# Patient Record
Sex: Male | Born: 1935 | Race: White | Hispanic: No | Smoking: Never smoker
Health system: Southern US, Community
[De-identification: ages and names within clinical notes are randomized; demographics above are authoritative.]

## PROBLEM LIST (undated history)

## (undated) DIAGNOSIS — K449 Diaphragmatic hernia without obstruction or gangrene: Secondary | ICD-10-CM

## (undated) DIAGNOSIS — K222 Esophageal obstruction: Secondary | ICD-10-CM

## (undated) DIAGNOSIS — K5790 Diverticulosis of intestine, part unspecified, without perforation or abscess without bleeding: Secondary | ICD-10-CM

## (undated) DIAGNOSIS — K644 Residual hemorrhoidal skin tags: Secondary | ICD-10-CM

## (undated) DIAGNOSIS — I493 Ventricular premature depolarization: Secondary | ICD-10-CM

## (undated) DIAGNOSIS — I472 Ventricular tachycardia, unspecified: Secondary | ICD-10-CM

## (undated) DIAGNOSIS — I2510 Atherosclerotic heart disease of native coronary artery without angina pectoris: Secondary | ICD-10-CM

## (undated) DIAGNOSIS — I10 Essential (primary) hypertension: Secondary | ICD-10-CM

## (undated) DIAGNOSIS — H3530 Unspecified macular degeneration: Secondary | ICD-10-CM

## (undated) DIAGNOSIS — Z86010 Personal history of colonic polyps: Secondary | ICD-10-CM

## (undated) DIAGNOSIS — Z860101 Personal history of adenomatous and serrated colon polyps: Secondary | ICD-10-CM

## (undated) DIAGNOSIS — E785 Hyperlipidemia, unspecified: Secondary | ICD-10-CM

## (undated) DIAGNOSIS — E079 Disorder of thyroid, unspecified: Secondary | ICD-10-CM

## (undated) DIAGNOSIS — K219 Gastro-esophageal reflux disease without esophagitis: Secondary | ICD-10-CM

## (undated) HISTORY — DX: Diaphragmatic hernia without obstruction or gangrene: K44.9

## (undated) HISTORY — DX: Ventricular tachycardia: I47.2

## (undated) HISTORY — DX: Diverticulosis of intestine, part unspecified, without perforation or abscess without bleeding: K57.90

## (undated) HISTORY — PX: TONSILLECTOMY: SHX5217

## (undated) HISTORY — DX: Esophageal obstruction: K22.2

## (undated) HISTORY — DX: Essential (primary) hypertension: I10

## (undated) HISTORY — DX: Ventricular premature depolarization: I49.3

## (undated) HISTORY — DX: Unspecified macular degeneration: H35.30

## (undated) HISTORY — DX: Ventricular tachycardia, unspecified: I47.20

## (undated) HISTORY — PX: CATARACT EXTRACTION: SUR2

## (undated) HISTORY — DX: Disorder of thyroid, unspecified: E07.9

## (undated) HISTORY — DX: Residual hemorrhoidal skin tags: K64.4

## (undated) HISTORY — PX: EYE SURGERY: SHX253

## (undated) HISTORY — DX: Hyperlipidemia, unspecified: E78.5

## (undated) HISTORY — DX: Personal history of colonic polyps: Z86.010

## (undated) HISTORY — DX: Personal history of adenomatous and serrated colon polyps: Z86.0101

## (undated) HISTORY — DX: Atherosclerotic heart disease of native coronary artery without angina pectoris: I25.10

## (undated) HISTORY — DX: Gastro-esophageal reflux disease without esophagitis: K21.9

---

## 1998-12-14 ENCOUNTER — Other Ambulatory Visit: Admission: RE | Admit: 1998-12-14 | Discharge: 1998-12-14 | Payer: Self-pay | Admitting: Gastroenterology

## 1999-12-20 ENCOUNTER — Ambulatory Visit (HOSPITAL_COMMUNITY): Admission: RE | Admit: 1999-12-20 | Discharge: 1999-12-20 | Payer: Self-pay | Admitting: Cardiovascular Disease

## 1999-12-20 HISTORY — PX: CARDIAC CATHETERIZATION: SHX172

## 2002-12-08 DIAGNOSIS — K644 Residual hemorrhoidal skin tags: Secondary | ICD-10-CM | POA: Insufficient documentation

## 2004-04-16 ENCOUNTER — Encounter (INDEPENDENT_AMBULATORY_CARE_PROVIDER_SITE_OTHER): Payer: Self-pay | Admitting: Gastroenterology

## 2004-04-16 DIAGNOSIS — K2980 Duodenitis without bleeding: Secondary | ICD-10-CM | POA: Insufficient documentation

## 2004-04-16 DIAGNOSIS — K449 Diaphragmatic hernia without obstruction or gangrene: Secondary | ICD-10-CM | POA: Insufficient documentation

## 2005-04-09 ENCOUNTER — Ambulatory Visit: Payer: Self-pay | Admitting: Gastroenterology

## 2007-03-16 ENCOUNTER — Ambulatory Visit: Payer: Self-pay | Admitting: Internal Medicine

## 2007-12-22 ENCOUNTER — Ambulatory Visit: Payer: Self-pay | Admitting: Internal Medicine

## 2007-12-28 ENCOUNTER — Ambulatory Visit: Payer: Self-pay | Admitting: Internal Medicine

## 2007-12-28 ENCOUNTER — Encounter: Payer: Self-pay | Admitting: Internal Medicine

## 2008-03-02 ENCOUNTER — Telehealth: Payer: Self-pay | Admitting: Internal Medicine

## 2008-06-30 ENCOUNTER — Telehealth: Payer: Self-pay | Admitting: Internal Medicine

## 2008-09-11 ENCOUNTER — Telehealth: Payer: Self-pay | Admitting: Internal Medicine

## 2008-12-15 ENCOUNTER — Inpatient Hospital Stay (HOSPITAL_COMMUNITY): Admission: EM | Admit: 2008-12-15 | Discharge: 2008-12-16 | Payer: Self-pay | Admitting: Emergency Medicine

## 2008-12-15 HISTORY — PX: CARDIAC CATHETERIZATION: SHX172

## 2008-12-16 ENCOUNTER — Encounter (INDEPENDENT_AMBULATORY_CARE_PROVIDER_SITE_OTHER): Payer: Self-pay | Admitting: *Deleted

## 2008-12-27 ENCOUNTER — Encounter: Payer: Self-pay | Admitting: Internal Medicine

## 2009-02-01 ENCOUNTER — Encounter: Payer: Self-pay | Admitting: Internal Medicine

## 2009-02-20 ENCOUNTER — Telehealth: Payer: Self-pay | Admitting: Internal Medicine

## 2009-02-21 ENCOUNTER — Telehealth: Payer: Self-pay | Admitting: Internal Medicine

## 2009-03-27 ENCOUNTER — Telehealth: Payer: Self-pay | Admitting: Internal Medicine

## 2009-07-09 ENCOUNTER — Ambulatory Visit: Payer: Self-pay | Admitting: Internal Medicine

## 2009-07-09 DIAGNOSIS — K219 Gastro-esophageal reflux disease without esophagitis: Secondary | ICD-10-CM | POA: Insufficient documentation

## 2009-07-09 DIAGNOSIS — Z860100 Personal history of colon polyps, unspecified: Secondary | ICD-10-CM | POA: Insufficient documentation

## 2009-07-09 DIAGNOSIS — R143 Flatulence: Secondary | ICD-10-CM

## 2009-07-09 DIAGNOSIS — R141 Gas pain: Secondary | ICD-10-CM

## 2009-07-09 DIAGNOSIS — R142 Eructation: Secondary | ICD-10-CM

## 2010-01-01 ENCOUNTER — Telehealth: Payer: Self-pay | Admitting: Internal Medicine

## 2010-01-25 ENCOUNTER — Telehealth: Payer: Self-pay | Admitting: Internal Medicine

## 2010-05-21 ENCOUNTER — Encounter: Payer: Self-pay | Admitting: Internal Medicine

## 2010-08-19 ENCOUNTER — Ambulatory Visit: Payer: Self-pay | Admitting: Internal Medicine

## 2010-10-24 ENCOUNTER — Ambulatory Visit: Payer: Self-pay | Admitting: Cardiovascular Disease

## 2010-11-21 NOTE — Assessment & Plan Note (Signed)
Summary: Followup-GERD, gas   History of Present Illness Visit Type: Follow-up Visit Primary GI MD: Yancey Flemings MD Primary Daviona Herbert: Susa Raring, MD Chief Complaint: GERD, intermittent diarrhea and painful gas History of Present Illness:   77 year old with a history of hypertension, hyperlipidemia, GERD complicated by peptic stricture requiring esophageal dilation, hypothyroidism, and adenomatous colon polyps. He presents today for followup. He reports that his GERD has been under good control on lansoprazole. No active reflux symptoms or recurrent dysphagia on medication. Significant symptoms off medication. He wish to discuss any concerns with long term PPI use. Next, ongoing problems with intestinal gas as described last year. He has had this problem, without significant change, for at least 10 years. Previous self treatment with Lactaid and Activa did not help. He also complains of a change in bowel habits as manifested by occasional loose stools for 2 days followed by no bowel movement for several days. This happens approximately every 3-6 months. No nausea, vomiting, weight loss, or bleeding. Some abdominal discomfort with gas that is immediately relieved with the passing of flatus or bowel movement. His surveillance colonoscopy is up-to-date.   GI Review of Systems    Reports acid reflux and  bloating.      Denies abdominal pain, belching, chest pain, dysphagia with liquids, dysphagia with solids, heartburn, loss of appetite, nausea, vomiting, vomiting blood, weight loss, and  weight gain.      Reports change in bowel habits and  diarrhea.     Denies anal fissure, black tarry stools, constipation, diverticulosis, fecal incontinence, heme positive stool, hemorrhoids, irritable bowel syndrome, jaundice, light color stool, liver problems, rectal bleeding, and  rectal pain.    Current Medications (verified): 1)  Lansoprazole 30 Mg Cpdr (Lansoprazole) .... Take One By Mouth Daily,30 Minutes  Before Breakfast. 2)  Levothroid 88 Mcg Tabs (Levothyroxine Sodium) .... Once Daily 3)  Lovastatin 20 Mg Tabs (Lovastatin) .... Once Daily 4)  Aspirin 81 Mg Tbec (Aspirin) .... Once Daily 5)  Icaps Plus  Tabs (Multiple Vitamins-Minerals) .... Take 1 Tablet By Mouth Two Times A Day 6)  Metoprolol Succinate 25 Mg Xr24h-Tab (Metoprolol Succinate) .... Take 1 Tablet By Mouth Once A Day  Allergies (verified): 1)  ! Penicillin  Past History:  Past Medical History: Reviewed history from 04/06/2008 and no changes required. Adenomatous Colon Polyps Diverticulosis GERD Hyperlipidemia Hypertension  Past Surgical History: Cataract Extraction Tonsillectomy  Family History: Reviewed history from 07/09/2009 and no changes required. Lymphoma:Son No FH of Colon Cancer: Family History of Heart Disease: Both Parents  Social History: Reviewed history from 07/09/2009 and no changes required. Occupation: Retired Patient has never smoked.  Alcohol Use - no Illicit Drug Use - no  Review of Systems  The patient denies allergy/sinus, anemia, anxiety-new, arthritis/joint pain, back pain, blood in urine, breast changes/lumps, confusion, cough, coughing up blood, depression-new, fainting, fatigue, fever, headaches-new, hearing problems, heart murmur, heart rhythm changes, itching, muscle pains/cramps, night sweats, nosebleeds, shortness of breath, skin rash, sleeping problems, sore throat, swelling of feet/legs, swollen lymph glands, thirst - excessive, urination - excessive, urination changes/pain, urine leakage, vision changes, and voice change.    Vital Signs:  Patient profile:   77 year old male Height:      74 inches Weight:      183 pounds BMI:     23.58 Pulse rate:   52 / minute Pulse rhythm:   irregular BP sitting:   122 / 70  (left arm) Cuff size:   regular  Vitals  Entered By: Francee Piccolo CMA Duncan Dull) (August 19, 2010 8:27 AM)  Physical Exam  General:  Well developed, well  nourished, no acute distress. Head:  Normocephalic and atraumatic. Eyes:  PERRLA, no icterus. Mouth:  No deformity or lesions Neck:  Supple; no masses or thyromegaly. Lungs:  Clear throughout to auscultation. Heart:  Regular rate and rhythm; no murmurs, rubs,  or bruits. Abdomen:  Soft, nontender and nondistended. No masses, hepatosplenomegaly or hernias noted. Normal bowel sounds. Msk:  Symmetrical with no gross deformities. Normal posture. Pulses:  Normal pulses noted. Extremities:  trace edema right ankle Neurologic:  Alert and  oriented x4 Skin:  Intact without significant lesions or rashes. Psych:  Alert and cooperative. Normal mood and affect.   Impression & Recommendations:  Problem # 1:  GERD (ICD-530.81) GERD complicated by peptic stricture(530.3). Currently asymptomatic post dilation on PPI. We discussed the risk benefit ratio of ongoing PPI therapy. Also discussed some concerns that have been raised with long term PPI use including bone density issues. He is comfortable with continuing medication  Plan: #1. Refill lansoprazole 30 mg daily. #2. Reflux precautions #3. Repeat esophageal dilation p.r.n. recurrent dysphagia #4. Routine GI followup in one year  Problem # 2:  FLATULENCE-GAS-BLOATING (ICD-787.3) chronic problem without real change. Also mentioning some mild alteration in bowel habits as described  Plan: #1. Empiric trial of probiotic Align. Samples given. #2. Previously given literature on intestinal gas  Problem # 3:  PERSONAL HX COLONIC POLYPS (ICD-V12.72) surveillance up-to-date. Due for followup around March 2014  Patient Instructions: 1)  Samples of Align given 2)  Please schedule a follow-up appointment in 1 year. 3)  Copy sent to : Susa Raring, MD 4)  The medication list was reviewed and reconciled.  All changed / newly prescribed medications were explained.  A complete medication list was provided to the patient / caregiver.

## 2010-11-21 NOTE — Medication Information (Signed)
Summary: Prescriber Response form/[facility]  Prescriber Response form/[facility]   Imported By: Sherian Rein 05/24/2010 10:45:07  _____________________________________________________________________  External Attachment:    Type:   Image     Comment:   External Document

## 2010-11-21 NOTE — Progress Notes (Signed)
Summary: Medication refill   Phone Note Call from Patient Call back at Home Phone [phone]   Caller: Patient Call For: Dr. Marina Goodell Reason for Call: Refill Medication Summary of Call: Refill Lansoprazole to [facility] Fax# [phone] Initial call taken by: Karna Christmas,  January 25, 2010 2:22 PM    Prescriptions: LANSOPRAZOLE 30 MG CPDR (LANSOPRAZOLE) Take one by mouth daily,30 minutes before breakfast.  #90 x 3   Entered by:   Milford Cage NCMA   Authorized by:   Hilarie Fredrickson MD   Signed by:   Milford Cage NCMA on 01/25/2010   Method used:   Electronically to        [facility]* (mail-order)       [address] [place], [place]  [address]       Ph: [phone]       Fax: [phone]   RxID:   [id number]

## 2010-11-21 NOTE — Progress Notes (Signed)
Summary: Refill   Phone Note Call from Patient Call back at Home Phone [phone]   Call For: Dr Marina Goodell Reason for Call: Talk to Nurse Summary of Call: Lansoprizole will only allow one refill is half way done with it and if he is to continue taking it we would need to call [facility]. Needs a 90 day supply. Although Dr Marina Goodell gave him 3 refills his insurance wants him to have some sort of contact with the Doctor before they allow him to get his refills.  Initial call taken by: Leanor Kail [facility],  January 01, 2010 10:50 AM  Follow-up for Phone Call        called [facility] and left message for them to return my call in regards to patient's Rx.  Milford Cage [facility]  January 02, 2010 8:37 AM Patient called and advised that he will not need refill until first of April and that [employer] will only allow 6 months worth at one time.  He will need follow up appt. in September with Dr. Marina Goodell.  Patient will call when he needs it. Follow-up by: Milford Cage NCMA,  January 02, 2010 10:15 AM

## 2010-11-22 NOTE — Medication Information (Signed)
Summary: Drug Utilization Review/[facility]  Drug Utilization Review/[facility]   Imported By: Sherian Rein 05/24/2010 10:47:43  _____________________________________________________________________  External Attachment:    Type:   Image     Comment:   External Document

## 2011-02-04 LAB — CBC
HCT: 41 % (ref 39.0–52.0)
Hemoglobin: 14.2 g/dL (ref 13.0–17.0)
MCHC: 34.4 g/dL (ref 30.0–36.0)
MCV: 95.7 fL (ref 78.0–100.0)
RBC: 4.3 MIL/uL (ref 4.22–5.81)
RBC: 4.68 MIL/uL (ref 4.22–5.81)
RDW: 13.4 % (ref 11.5–15.5)

## 2011-02-04 LAB — BASIC METABOLIC PANEL
BUN: 12 mg/dL (ref 6–23)
Calcium: 8.4 mg/dL (ref 8.4–10.5)
GFR calc non Af Amer: >60 mL/min (ref >60)
Glucose, Bld: 97 mg/dL (ref 70–99)
Potassium: 3.5 mEq/L (ref 3.5–5.1)

## 2011-02-04 LAB — POCT I-STAT, CHEM 8
BUN: 18 mg/dL (ref 6–23)
Calcium, Ion: 1.15 mmol/L (ref 1.12–1.32)
Chloride: 101 meq/L (ref 96–112)
Creatinine, Ser: 1.2 mg/dL (ref 0.4–1.5)
Glucose, Bld: 179 mg/dL — ABNORMAL HIGH (ref 70–99)
HCT: 43 % (ref 39.0–52.0)
Hemoglobin: 14.6 g/dL (ref 13.0–17.0)
Potassium: 3.4 mEq/L — ABNORMAL LOW (ref 3.5–5.1)
Sodium: 142 mEq/L (ref 135–145)
TCO2: 26 mmol/L (ref 0–100)

## 2011-02-04 LAB — LIPID PANEL
Cholesterol: 124 mg/dL (ref 0–200)
HDL: 38 mg/dL — ABNORMAL LOW (ref >39)
LDL Cholesterol: 75 mg/dL (ref 0–99)
Total CHOL/HDL Ratio: 3.3 ratio
Triglycerides: 57 mg/dL (ref <150)
VLDL: 11 mg/dL (ref 0–40)

## 2011-02-04 LAB — DIFFERENTIAL
Lymphocytes Relative: 9 % — ABNORMAL LOW (ref 12–46)
Lymphs Abs: 1 10*3/uL (ref 0.7–4.0)
Neutrophils Relative %: 86 % — ABNORMAL HIGH (ref 43–77)

## 2011-02-04 LAB — COMPREHENSIVE METABOLIC PANEL
AST: 23 U/L (ref 0–37)
CO2: 31 mEq/L (ref 19–32)
Calcium: 8.9 mg/dL (ref 8.4–10.5)
Creatinine, Ser: 0.86 mg/dL (ref 0.4–1.5)
GFR calc Af Amer: >60 mL/min (ref >60)
GFR calc non Af Amer: >60 mL/min (ref >60)
Glucose, Bld: 169 mg/dL — ABNORMAL HIGH (ref 70–99)

## 2011-02-04 LAB — PROTIME-INR
INR: 1.1 (ref 0.00–1.49)
Prothrombin Time: 14.2 seconds (ref 11.6–15.2)

## 2011-02-04 LAB — CARDIAC PANEL(CRET KIN+CKTOT+MB+TROPI)
CK, MB: 1.3 ng/mL (ref 0.3–4.0)
CK, MB: 2.3 ng/mL (ref 0.3–4.0)
Relative Index: 2.3 (ref 0.0–2.5)
Relative Index: INVALID (ref 0.0–2.5)
Total CK: 102 U/L (ref 7–232)
Total CK: 85 U/L (ref 7–232)
Troponin I: <0.01 ng/mL (ref 0.00–0.06)

## 2011-02-04 LAB — TSH: TSH: 1.2 u[IU]/mL (ref 0.350–4.500)

## 2011-02-04 LAB — APTT: aPTT: 31 seconds (ref 24–37)

## 2011-02-17 ENCOUNTER — Telehealth: Payer: Self-pay | Admitting: Internal Medicine

## 2011-02-17 NOTE — Telephone Encounter (Signed)
Left message for pt to call back.  Pt states his pharmacy has requested a refill and not heard from [place] as of yet. Cannot find any faxes or documentation where the pharmacy has sent for a refill. Attempted to call [facility] and got answering machine. Called pt back and let him know that I have not seen any forms and pt will call the pharmacy back and request refill. Gave pt the fax number to have the pharmacy send the request to.

## 2011-03-04 NOTE — Consult Note (Signed)
NAME:  Todd Benton, Todd Benton NO.:  [id number]   MEDICAL RECORD NO.:  [id number]          PATIENT TYPE:  INP   LOCATION:  2013                         FACILITY:  [facility]   PHYSICIAN:  Corinna L. Lendell Benton, MDDATE OF BIRTH:  [birth year]   DATE OF CONSULTATION:  12/16/2008  DATE OF DISCHARGE:                                 CONSULTATION   REASON FOR CONSULTATION:  Noncardiac chest pain.   IMPRESSIONS/RECOMMENDATIONS:  1. Pain:  I suspect this is GI related.  He reports a history of      chronic gas pains and has a history of esophageal stricture which      has been dilated.  He also has alternating constipation and      diarrhea.  The chest x-ray shows dilated bowel in the upper      abdomen.  I will get abdominal films to evaluate further and look      for ileus or obstruction.  If this shows no obstruction or ileus, a      trial of Carafate may be warranted, as the patient is already on      proton pump inhibitor.  Also I did recommend he try a diet without      dairy products for a week or so.  He may be lactose intolerant.  2. Orthostatic dizziness:  I recommended holding hydrochlorothiazide,      checking orthostatic vitals, and giving normal saline bolus.  3. Hypothyroidism:  TSH normal.  4. Hyperlipidemia.  5. Hypertension.   If abdominal films are unremarkable, the patient can be discharged from  my perspective.  He will need a trial of Carafate 1 g p.o. q.a.c. and  nightly and I would hold hydrochlorothiazide and reassess as an  outpatient in a week or two.   HISTORY OF PRESENT ILLNESS:  Todd Benton is a pleasant 77 year old white  male patient of Todd Benton who was admitted after cardiac  catheterization yesterday.  He had nonobstructive coronary artery  disease and normal ejection fraction and his pain was not felt to be  secondary to coronary artery disease.  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  He felt dizzy and fatigued and had to sit down.  Currently, he feels fine, but he has not been out of bed.  When I did  have him sit up, he became immediately dizzy.  He reports no flatus over  the past 15 hours which is unusual for him because he is usually very  gassy.  He has tolerated supper and is hungry.  He has had nausea,  particularly when he stands up or sits up.  He also has a history of  vertigo and he did feel a spinning sensation yesterday.   PAST MEDICAL HISTORY:  As above.   MEDICATIONS:  1. Hydrochlorothiazide 25 mg a day.  2. Synthroid 88 mcg a day.  3. Aspirin 81 mg a day.  4. Protonix 40 mg a day.  5. Simvastatin 10 mg a day.   SOCIAL  HISTORY:  The patient is married.  He denies heavy drinking.  He  does not smoke.   FAMILY HISTORY:  His father died at age 71 of a heart attack.  Mother  had an MI in her 47s.  Several brothers with coronary artery disease.   REVIEW OF SYSTEMS:  As above, otherwise negative.   PHYSICAL EXAMINATION:  VITAL SIGNS:  His temperature is 97.7, blood  pressure has ranged 106-116/60-63 supine, respiratory rate is 18, no  orthostatic have been done, oxygen saturation 98% on room air.  GENERAL:  The patient is well nourished, well developed in no acute  distress.  HEENT:  Normocephalic, atraumatic.  Pupils are equal, round, and  reactive to light.  Sclerae nonicteric.  Moist mucous membranes.  NECK:  Supple.  No carotid bruits.  No lymphadenopathy.  LUNGS:  Clear to auscultation bilaterally without wheezes, rhonchi, or  rales.  CARDIOVASCULAR:  Regular rate and rhythm without murmurs, gallops, or  rubs.  ABDOMEN:  Diminished bowel sounds, soft, nontender, nondistended.  GU AND RECTAL:  Deferred.  EXTREMITIES:  No clubbing, cyanosis, or edema.  SKIN:  No rash.  No diaphoresis.  NEUROLOGIC:  The patient is alert and oriented.  Cranial nerves intact.  Strength intact.  Romberg negative.  He did have  orthostatic dizziness  when he sat up.  Finger-to-nose normal.  PSYCHIATRIC:  Normal affect.   LABORATORY DATA:  CBC unremarkable.  PT/PTT normal.  Basic metabolic  panel normal.  Serial cardiac enzymes negative.  LDL 75, HDL 38,  triglycerides 57, TSH 1.2.  EKG showed sinus bradycardia with a rate of  58, PVC, nonspecific changes.  Chest x-ray showed no infiltrate.  Heart  size is upper normal.  Mild gaseous distention of bowel loop versus  stomach in the upper abdomen that was partially imaged.   Thank you Dr. Elease Benton for this consultation.  Further recommendations to  follow based on data base.      Corinna L. Lendell Caprice, MD  Electronically Signed     CLS/MEDQ  D:  12/16/2008  T:  12/16/2008  Job:  [id number]   cc:   Todd Benton, M.D.  Todd Benton, M.D.

## 2011-03-04 NOTE — Cardiovascular Report (Signed)
NAME:  [name], [name] NO.:  [id number]   MEDICAL RECORD NO.:  [id number]          PATIENT TYPE:  INP   LOCATION:  2013                         FACILITY:  [facility]   PHYSICIAN:  Vesta Mixer, M.D. DATE OF BIRTH:  03/28/1930   DATE OF PROCEDURE:  12/15/2008  DATE OF DISCHARGE:                            CARDIAC CATHETERIZATION   Todd Benton is a 77 year old gentleman with a history of mild coronary  artery irregularities.  He presented to the office today with episodes  of chest pain and ST-segment depression consistent with coronary  ischemia.  He was referred for heart catheterization.  He was admitted  to the hospital.   PROCEDURE:  Left heart catheterization with coronary angiography.   The right femoral artery was easily cannulated using a modified  Seldinger technique.   HEMODYNAMICS:  LV pressure is 110/10 with an aortic pressure of 104/54.   ANGIOGRAPHY:  Left main:  Left main has minor coronary artery  irregularities.   The left anterior descending artery has mild irregularities proximally.  The mid vessel has a 30% stenosis in a napkin ring stenosis of 30%.  The  distal LAD has mild irregularities.   The left circumflex artery has minor luminal irregularities.  Obtuse  marginal arteries are normal.   The right coronary artery has minor irregularities in the proximal  segment.  The mid vessel has a focal 30-40% stenosis.  The mid vessel is  smooth and fairly normal.   The posterior descending artery and the posterolateral segment artery  are normal.   The left ventriculogram was performed in the 30-RAO position.  It  reveals an ejection fraction of around 50%.  There was some arrhythmias  and catheter-induced ventricular ectopy.  There is possible slight  hypokinesis of the anterior wall.   COMPLICATIONS:  None.   CONCLUSION:  Nonobstructive coronary arteries.  His left ventricular  systolic function is at the lower limits of normal.  He  had some  segmental wall motion abnormalities, but I think they are probably due  to catheter-induced ectopy.  He does not have any severe coronary artery  disease to explain his chest pain.  We will need to look for other  etiologies of chest pain.      Vesta Mixer, M.D.  Electronically Signed     PJN/MEDQ  D:  12/15/2008  T:  12/16/2008  Job:  [id number]   cc:   Oley Balm. Georgina Pillion, M.D.

## 2011-03-04 NOTE — Assessment & Plan Note (Signed)
[facility]                         GASTROENTEROLOGY OFFICE NOTE   NAME:Todd Benton, Todd Benton                       MRN:          [id number]  DATE:03/16/2007                            DOB:          06-26-1930    REASON FOR CONSULTATION:  Increased intestinal gas, medication refill,  and to establish as a new patient.   HISTORY:  This is a 77 year old white male with a history of adenomatous  colon polyps, and gastroesophageal reflux disease complicated by peptic  stricture, for which he has undergone prior esophageal dilation.  The  patient has not been seen in this office for 3 years.  He has had  several colonoscopies, the most recent of which was in February 2004.  There were no colon polyps at that time.  Followup in 5 years  recommended.  The patient underwent upper endoscopy for a symptomatic  peptic stricture in June 2005.  He was dilated with a 54 [place] Maloney  dilator, and placed on Prevacid 30 mg daily.  He has continued on  Prevacid, and has had no recurrent dysphagia.  He needs a refill of his  Prevacid Solu Tabs.  The patient complains of chronic intestinal gas  that is manifested by cramping gas pains and flatus.  The problem has  been worse in recent months.  No nausea, vomiting, or change in bowel  habits.  His bowels tend to be a bit on the constipated side.  No new  medications or change in diet.  He does use Gas-X and Pepto Bismol with  variable results.   ALLERGIES:  PENICILLIN.   CURRENT MEDICATIONS:  1. Levothyroxine 88 mcg daily.  2. Lovastatin 20 mg daily.  3. Hydrochlorothiazide 25 mg daily.  4. Prevacid Solu Tab 30 mg daily.  5. Baby aspirin 81 mg daily.   PAST MEDICAL HISTORY:  1. Hypothyroidism.  2. Hyperlipidemia.  3. Reflux disease with stricture.  4. Adenomatous colon polyps.   FAMILY HISTORY:  Heart disease in his parents and brother.  Negative for  GI malignancy.   SOCIAL HISTORY:  Patient is retired from  [employer].  He is married.  He does  not smoke or use alcohol.  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   PHYSICAL EXAMINATION:  Well-appearing male, in no acute distress.  Blood pressure is 110/70.  Heart rate is 68.  Weight is 188 pounds.  HEENT:  Sclerae anicteric.  Conjunctivae are pink.  LUNGS:  Clear.  HEART:  Regular.  ABDOMEN:  Soft without tenderness, mass or hernia.  Good bowel sounds  heard.   IMPRESSION:  1. Gastroesophageal reflux disease complicated by peptic stricture.      Currently asymptomatic, on Prevacid, postdilation.  2. History of adenomatous colon polyps.  Last colonoscopy negative.      Followup due in February 2009.  3. Increased intestinal gas manifested by cramping and flatus.      Chronic problem.  Worsened recently.   RECOMMENDATIONS:  1. Refill Prevacid Solu Tab 30 mg daily as requested.  2. Trial of the probiotic Align 1 daily for 2 weeks  to see if this      improves his gas.  3. A brochure on increased intestinal gas as well as anti-gas and      flatulence dietary sheet.  4. Surveillance colonoscopy in 2009 as recommended.  5. Ongoing general medical care with Dr. Georgina Pillion.     Wilhemina Bonito. Marina Goodell, MD  Electronically Signed    JNP/MedQ  DD: 03/16/2007  DT: 03/16/2007  Job #: [id number]   cc:   Oley Balm. Georgina Pillion, M.D.

## 2011-03-04 NOTE — H&P (Signed)
NAME:  NASIER, THUMM NO.:  [id number]   MEDICAL RECORD NO.:  [id number]          PATIENT TYPE:  EMS   LOCATION:  [facility]                         FACILITY:  [facility]   PHYSICIAN:  Vesta Mixer, M.D. DATE OF BIRTH:  1930-07-08   DATE OF ADMISSION:  12/15/2008  DATE OF DISCHARGE:                              HISTORY & PHYSICAL   Todd Benton is an elderly gentleman with a history of chest pains and  hypothyroidism.  He is admitted today with an episode of chest pain and  EKG changes.   Todd Benton was seen last in our office approximately 4 years ago.  He  has had a heart catheterization, which revealed minor coronary artery  irregularities.  He is overall done fairly well and has not been seen  since that time.   Starting several days ago, he started having some generalized weakness  and some shortness of breath especially with exertion.  This morning, he  has had intense nausea with chest pressure.  He feels like he needs to  belch continuously.  The pain was a 4/10 when he arrived.  He was given  nitroglycerin with some relief with fairly promptly relief of the pain.  He has had some generalized weakness and states he just does not feel  right.  He denies any cough or cold or sputum production.  He denies any  fever.  He denies any PND or orthopnea.  He denies any syncope or  presyncope.   He denies any blood in the stool, blood in his urine.  He denies any  aches and pains, rash or skin nodules.  He denies any weight gain or  weight loss.   CURRENT MEDICATIONS:  1. Synthroid 88 mcg a day.  2. Aspirin 81 mg a day.  3. Prevacid 30 mg a day.  4. Lovastatin 20 mg a day.  5. Hydrochlorothiazide 25 mg.  6. ICaps once a day.   ALLERGIES:  He is allergic to PENICILLIN.   PAST MEDICAL HISTORY:  1. Hypothyroidism.  2. Hyperlipidemia.  3. History of iron deficiency anemia.  4. Hypertension.  5. Chest pain.   SOCIAL HISTORY:  The patient is a nonsmoker.  He  does not drink alcohol.   FAMILY HISTORY:  Positive for myocardial infarction.  His father died at  age 23 of an MI.  His mother had an MI in her 59s but lived to be 48.  He had several brothers with coronary artery disease.   REVIEW OF SYSTEMS:  Reviewed in the history of present illness.  All  other systems are reviewed and are negative.   PHYSICAL EXAMINATION:  GENERAL:  He is an elderly gentleman in no acute  distress.  He is alert and oriented x3 and his mood and affect are  normal.  VITAL SIGNS:  His weight is 188, blood pressure initially was 100/60  with a heart rate of 64.  Several hours later, his blood pressure was  130/65.  NECK:  2+ carotids.  He has no bruits, no JVD, no thyromegaly.  His neck  is supple.  LUNGS:  Clear.  HEART:  Regular rate S1 and S2.  His PMI is nondisplaced.  BACK:  Nontender.  ABDOMEN:  Good bowel sounds.  He has no hepatosplenomegaly.  There is no  guarding or rebound.  EXTREMITIES:  He has 2+ pulses.  SKIN:  Warm and dry.  Skin does appear to be a bit ashen, but there is  no diaphoresis.  EXTREMITIES:  He has no clubbing, cyanosis, or edema.  His gait is  normal.   EKG reveals normal sinus rhythm.  He has ST-segment depression in the  inferior and lateral leads.   LABORATORY DATA:  Pending.   Todd Benton presents with several hours of chest pain.  He has ST-  segment depression in the inferolateral leads.  We will schedule him.  We will go ahead and admit him to the hospital and place him on a nitro  drip.  We will anticipate heart catheterization tonight.  We have  discussed the risks, benefits, and options of heart catheterization.  He  understands and agrees to proceed.      Vesta Mixer, M.D.  Electronically Signed     PJN/MEDQ  D:  12/15/2008  T:  12/16/2008  Job:  [id number]   cc:   Oley Balm. Georgina Pillion, M.D.

## 2011-03-04 NOTE — Assessment & Plan Note (Signed)
[facility]                         GASTROENTEROLOGY OFFICE NOTE   Kadrian, Partch GEN CLAGG                       MRN:          [id number]  DATE:12/22/2007                            DOB:          02-Jun-1930    HISTORY:  Todd Benton presents today with complaints of diarrhea and  increased intestinal gas.  He is a 77 year old white male with a history  of adenomatous colon polyps, gastroesophageal reflux disease,  complicated by peptic stricture, for which he has undergone prior  esophageal dilation; hypothyroidism, and hyperlipidemia.  The patient  was last evaluated in the office Mar 16, 2007, for increased intestinal  gas, medication refill, and to establish as a new GI patient.  For his  reflux, he is on Prevacid SoluTab 30 mg daily.  At the time of his last  visit, he was given a two-week trial of the probiotic, Align.  He did  not feel this improved his gas.  He is due for surveillance colonoscopy  at this time.  He reports to me, two weeks ago, developing problems with  loose, watery stools, as well as increased gas and hyperactive bowel  sounds.  Stools were loose and runny without blood.  No nausea,  vomiting, fevers or weight-loss.  He thought possibly the symptoms were  due to Prevacid, as he had read this to be a potential side effect.  However, he had been on the medication for three and a half years prior  without similar symptoms.  He discontinued the Prevacid and has been off  the medication for about ten days.  Symptoms may have improved some.  He  also received a letter from [facility] questioning his chronic need for  proton pump inhibitor therapy.  I reviewed with him that he has reflux  disease, complicated by peptic stricture, that has required esophageal  dilation.  The proton pump inhibitor is good for control of symptoms, as  well as reducing damage to the esophagus with recurrent stricturing.  Patient denies mouth ulcers, recent  antibiotic treatment, unusual  travel, or exposure to persons with similar symptoms.   He is ALLERGIC TO PENICILLIN.   CURRENT MEDICATIONS INCLUDE:  Levothyroxine, Lovastatin,  hydrochlorothiazide, Prevacid SoluTab, baby aspirin, and multivitamin.   PHYSICAL EXAM:  Finds a well-appearing male, in no acute distress.  Blood pressure is 154/54, heart rate 72 and regular, weight is 188.6  pounds.  HEENT:  Sclerae are anicteric, conjunctivae are pink.  Oral mucosa is  intact, no aphthous ulcerations.  LUNGS:  Clear.  HEART:  Regular.  ABDOMEN:  Soft without tenderness, mass or hernia.  Bowel sounds are  active.  EXTREMITIES:  Without edema.   IMPRESSION:  1. Recent problems with loose stools, nonspecific.  Most likely self-      limited infectious process.  2. Chronic problems with gas and bloating.  3. History of reflux disease, complicated by peptic stricture,      requiring esophageal dilation.  Currently without recurrent      dysphagia.  4. History of adenomatous colon polyps, due for surveillance.   RECOMMENDATIONS:  1. Empiric trial  of metronidazole 250 mg q.i.d. for one week to see if      this impacts problems with gas and diarrhea.  2. Okay to hold Prevacid for now, but should resume, if he develops      indigestion or heartburn.  Will likely need something long-term to      prevent premature recurrence of esophageal stricturing.  3. Schedule colonoscopy with polypectomy if needed, and possible      biopsies if loose stools persist.  The nature of the procedure, as      well as the risks, benefits, and alternatives, were discussed in      detail.  He understood and agreed to proceed.  4. Continue general medical care with Dr. Georgina Pillion.     Wilhemina Bonito. Marina Goodell, MD  Electronically Signed    JNP/MedQ  DD: 12/22/2007  DT: 12/22/2007  Job #: [id number]   cc:   Oley Balm. Georgina Pillion, M.D.

## 2011-03-07 NOTE — Discharge Summary (Signed)
NAME:  Todd Benton, Todd Benton NO.:  [id number]   MEDICAL RECORD NO.:  [id number]          PATIENT TYPE:  INP   LOCATION:  2013                         FACILITY:  [facility]   PHYSICIAN:  Vesta Mixer, M.D. DATE OF BIRTH:  1930/07/11   DATE OF ADMISSION:  12/15/2008  DATE OF DISCHARGE:  12/16/2008                               DISCHARGE SUMMARY   DISCHARGE DIAGNOSES:  1. Noncardiac chest pain.  2. Possible gastrointestinal pain.  3. Hypothyroidism.  4. Hyperlipidemia.  5. Hypertension.   DISCHARGE MEDICATIONS:  1. Synthroid 88 mcg a day.  2. Aspirin 81 mg a day.  3. Prevacid 30 mg a day.  4. Lovastatin 20 mg a day.  5. ICaps once a day.   DISPOSITION:  The patient will see Dr. Elease Hashimoto in a week or two.  He is  to follow up with his general medical doctor Lajean Manes in the next  week or two.   HISTORY:  Todd Benton is an elderly gentleman who was admitted through  the office with episodes of chest pain and EKG changes.  Please see  dictated H and P for further details.   HOSPITAL COURSE BY PROBLEMS:  Chest pain.  The patient was admitted and  underwent urgent heart catheterization.  He was found to have only minor  luminal irregularities.  He did have some moderate stenosis but none  were felt to be significant.  His left ventricular systolic function was  fairly normal.  His EF was 50%.  There was no mitral regurgitation.   The patient felt quite better after the heart catheterization and did  not have any further episodes of chest pain.  He ruled out for  myocardial infarctions.  He was seen by Dr. Crista Curb for  consultation.  He will follow up for these episodes of pain with Dr.  Elease Hashimoto in a week or two and will follow up with Dr. Georgina Pillion in several  weeks.  All his other medical problems were relatively stable.      Vesta Mixer, M.D.  Electronically Signed     PJN/MEDQ  D:  02/01/2009  T:  02/02/2009  Job:  [id number]

## 2011-03-07 NOTE — Cardiovascular Report (Signed)
[place]  Patient:    Todd Benton, Todd Benton                         MRN: [id number] Proc. Date: 12/20/99 Adm. Date:  [id number] Disc. Date: [id number] Attending:  Koren Bound CC:         Cardiac Catheterization Laboratory             Oley Balm. Georgina Pillion, M.D.                        Cardiac Catheterization  PROCEDURE:  Left heart catheterization with coronary angiography.  CARDIOLOGIST:  Alvia Grove., M.D.  INDICATIONS:  [name] is a 77 year old gentleman with a history of chest pain in the past.  He recently had an adenosine Cardiolite study which revealed attenuation in the inferior wall, consistent with inferior wall myocardial infarction.  He also had small Q-waves in the inferior leads.  He is referred for a heart catheterization for further evaluation.  DESCRIPTION OF PROCEDURE:  The right femoral artery was easily cannulated using a modified Seldinger technique.  HEMODYNAMICS: LV pressure:  142/25. Aortic pressure:  141/81.  ANGIOGRAPHY: 1. Left main coronary artery:  Relatively smooth and normal. 2. Left anterior descending coronary artery:  Has minor luminal irregularities.    There are no stenoses greater than 10% or 20%.  There are three moderate-sized    diagonal branches, each of which are essentially normal.  The distal LAD    reaches around the apex and supplies a good bit of the inferoapical wall. 3. Left circumflex coronary artery:  Is a moderate-sized vessel.  It supplies a    large first obtuse marginal artery and a medium-sized second obtuse marginal    artery.  These vessels are essentially normal. 4. Right coronary artery:  Is a large and dominant vessel.  There is a 20%    stenosis in the mid-vessel.  The stenosis does not appear to obstruct    blood flow.  The posterior descending artery and the posterior lateral    segment arteries are normal.  LEFT VENTRICULOGRAM:  Was performed in a 30-degree RAO  position.  There is some  catheter-induced ectopy but the overall ejection fraction appears to be normal.  The ejection fraction is approximately 65%-70%.  There is no mitral regurgitation. In particular the inferior wall contracts normally.  COMPLICATIONS:  None.  CONCLUSIONS: 1. Essentially normal coronary arteries, with only minor luminal irregularities.    This degree of minor irregularities is consistent with someone 77 years old. 2. Normal left ventricular systolic function. DD:  12/20/99 TD:  12/22/99 Job: [id number]

## 2011-04-29 ENCOUNTER — Encounter: Payer: Self-pay | Admitting: Cardiovascular Disease

## 2011-05-06 ENCOUNTER — Ambulatory Visit (INDEPENDENT_AMBULATORY_CARE_PROVIDER_SITE_OTHER): Payer: Medicare Other | Admitting: Cardiovascular Disease

## 2011-05-06 ENCOUNTER — Encounter: Payer: Self-pay | Admitting: Cardiovascular Disease

## 2011-05-06 VITALS — BP 130/88 | HR 66 | Ht 73.0 in | Wt 188.2 lb

## 2011-05-06 DIAGNOSIS — I4949 Other premature depolarization: Secondary | ICD-10-CM

## 2011-05-06 DIAGNOSIS — I493 Ventricular premature depolarization: Secondary | ICD-10-CM | POA: Insufficient documentation

## 2011-05-06 NOTE — Progress Notes (Signed)
Rinaldo Cloud Date of Birth  April 16, 1930 [facility] [address]     [address] [place], [place]  [address] [phone]  Fax  [phone]  History of Present Illness:  No medical issues.  Pt has been under lots of stress with the recent death of his brother. Had to manage his brother's estate.  Lots of mental stress.  Not yet exercising yet.  Current Outpatient Prescriptions on File Prior to Visit  Medication Sig Dispense Refill  . aspirin 81 MG tablet Take 81 mg by mouth daily.        . lansoprazole (PREVACID) 30 MG capsule Take 30 mg by mouth daily.        Marland Kitchen levothyroxine (SYNTHROID, LEVOTHROID) 88 MCG tablet Take 88 mcg by mouth daily.        Marland Kitchen lovastatin (MEVACOR) 20 MG tablet Take 20 mg by mouth at bedtime.        . LUTEIN PO Take by mouth daily.        . Multiple Vitamins-Minerals (OCUVITE PRESERVISION PO) Take by mouth daily.        Marland Kitchen POTASSIUM CHLORIDE PO Take 99 mg by mouth 2 (two) times daily. 200 mg daily        Allergies  Allergen Reactions  . Penicillins     Past Medical History  Diagnosis Date  . Thyroid disease   . Hypertension   . Hyperlipidemia   . Coronary artery disease     nonobstuctive  . Premature ventricular contractions     frequent  . Ventricular tachycardia     nonsustained    Past Surgical History  Procedure Date  . Tonsillectomy     at age 26  . Cardiac catheterization 12/15/2008    EF50%/nonobstuctive coronary arteries/lt ventricular systolic function is at the lower limits of normal/segmental wall motion abnormalities probably due to catheter-induced ectopy/no severe CAD to explain chest pain  . Cardiac catheterization 12/20/1999    EF65/70%/normal coronary arteries with only minor luminal irregularities consistent with someone 77 yrs old/normal lt ventricular systolic function    History  Smoking status  . Never Smoker   Smokeless tobacco  . Not on file    History  Alcohol Use No     Family History  Problem Relation Age of Onset  . Heart attack Mother   . Stroke Mother   . Heart attack Father 48  . Cancer Son 8    Reviw of Systems:  Reviewed in the HPI.  All other systems are negative.  Physical Exam: BP 130/88  Pulse 66  Ht 6\' 1"  (1.854 m)  Wt 188 lb 3.2 oz (85.367 kg)  BMI 24.83 kg/m2 The patient is alert and oriented x 3.  The mood and affect are normal.   Skin: warm and dry.  Color is normal.    HEENT:   the sclera are nonicteric.  The mucous membranes are moist.  The carotids are 2+ without bruits.  There is no thyromegaly.  There is no JVD.    Lungs: clear.  The chest wall is non tender.    Heart: regular rate with a normal S1 and S2.  There are no murmurs, gallops, or rubs. The PMI is not displaced.     Abdomin: good bowel sounds.  There is no guarding or rebound.  There is no hepatosplenomegaly or tenderness.  There are no masses.   Extremities:  no clubbing, cyanosis, or edema.  The legs are without  rashes.  The distal pulses are intact.   Neuro:  Cranial nerves II - XII are intact.  Motor and sensory functions are intact.    The gait is normal.  ECG: Normal sinus rhythm with frequent premature ventricular contractions and a pattern of bigeminy.  Assessment / Plan:

## 2011-05-06 NOTE — Patient Instructions (Signed)
Begin exercising as soon as you can.

## 2011-05-06 NOTE — Assessment & Plan Note (Signed)
Todd Benton is very stable. He continues to have some PVCs but overall these are very benign. He has not had any limitations. I'll see him again in 6 months.

## 2011-05-07 ENCOUNTER — Encounter: Payer: Self-pay | Admitting: Cardiovascular Disease

## 2011-06-10 ENCOUNTER — Ambulatory Visit (INDEPENDENT_AMBULATORY_CARE_PROVIDER_SITE_OTHER): Payer: Medicare Other | Admitting: Internal Medicine

## 2011-06-10 ENCOUNTER — Encounter: Payer: Self-pay | Admitting: Internal Medicine

## 2011-06-10 VITALS — BP 132/72 | HR 99 | Temp 97.8°F | Ht 73.0 in | Wt 184.8 lb

## 2011-06-10 DIAGNOSIS — R059 Cough, unspecified: Secondary | ICD-10-CM

## 2011-06-10 DIAGNOSIS — R05 Cough: Secondary | ICD-10-CM | POA: Insufficient documentation

## 2011-06-10 MED ORDER — TRAMADOL HCL 50 MG PO TABS: ORAL_TABLET | ORAL | Status: AC

## 2011-06-10 MED ORDER — PREDNISONE (PAK) 10 MG PO TABS: ORAL_TABLET | ORAL | Status: AC

## 2011-06-10 NOTE — Progress Notes (Signed)
  Subjective:    Patient ID: Todd Benton, male    DOB: [birth year], 77 y.o.   MRN: [id number]  HPI  60 yowm never smoker with h/o GERD and  seasonal allergies resolved in 30s/ 40s  referred for cough by Dr Azucena Cecil 06/10/2011   06/10/2011 Initial pulmonary office eval cc acute onset  Mostly dry  cough assoc with sensation of  pnds but no  anterior assoc with fever of 100.9 and rx with zpak / tessilon > no better and worse lying flat ok propped up on 2 pillows, never improved so  rx with levaquin.> coughing up scant white phlegm. cxr 8/14 ok.   No sob.    Pt denies any significant sore throat, dysphagia, itching, sneezing,  nasal congestion or excess/ purulent secretions,  fever, chills, sweats, unintended wt loss, pleuritic or exertional cp, hempoptysis, orthopnea pnd or leg swelling.    Also denies any obvious fluctuation of symptoms with weather or environmental changes or other aggravating or alleviating factors.    Review of Systems  Constitutional: Negative for fever, chills, activity change, appetite change and unexpected weight change.  HENT: Negative for congestion, sore throat, rhinorrhea, sneezing, trouble swallowing, dental problem, voice change and postnasal drip.   Eyes: Negative for visual disturbance.  Respiratory: Positive for cough. Negative for choking and shortness of breath.   Cardiovascular: Negative for chest pain and leg swelling.  Gastrointestinal: Negative for nausea, vomiting and abdominal pain.  Genitourinary: Negative for difficulty urinating.  Musculoskeletal: Negative for arthralgias.  Skin: Negative for rash.  Psychiatric/Behavioral: Negative for behavioral problems and confusion.       Objective:   Physical Exam Pleasant amb wm mod hoarse coughs mid expiration HEENT: nl dentition, turbinates, and orophanx. Nl external ear canals without cough reflex   NECK :  without JVD/Nodes/TM/ nl carotid upstrokes bilaterally   LUNGS: no acc muscle use,  A few  mostly exp scattered rhonchi bilaterally with cough elicited on fvc maneuver at mid exp   CV:  RRR  no s3 or murmur or increase in P2, no edema   ABD:  soft and nontender with nl excursion in the supine position. No bruits or organomegaly, bowel sounds nl  MS:  warm without deformities, calf tenderness, cyanosis or clubbing  SKIN: warm and dry without lesions    NEURO:  alert, approp, no deficits       Assessment & Plan:

## 2011-06-10 NOTE — Patient Instructions (Signed)
GERD (REFLUX)  is an extremely common cause of respiratory symptoms, many times with no significant heartburn at all.    It can be treated with medication, but also with lifestyle changes including avoidance of late meals, excessive alcohol, smoking cessation, and avoid fatty foods, chocolate, peppermint, colas, red wine, and acidic juices such as orange juice.  NO MINT OR MENTHOL PRODUCTS SO NO COUGH DROPS  USE SUGARLESS CANDY INSTEAD (jolley ranchers or Stover's)  NO OIL BASED VITAMINS   Prevacid 30mg  30 min before bfast and add pepcid 20 mg at bedtime.  Take delsym two tsp every 12 hours and supplement if needed with  tramadol 50 mg up to 2 every 4 hours to suppress the urge to cough. Swallowing water or using ice chips/non mint and menthol containing candies (such as lifesavers or sugarless jolly ranchers) are also effective.  You should rest your voice and avoid activities that you know make you cough.  Once you have eliminated the cough for 3 straight days try reducing the tramadol first,  then the delsym as tolerated.   Prednisone 10 mg take  4 each am x 2 days,   2 each am x 2 days,  1 each am x2days and stop    If you are satisfied with your treatment plan let your doctor know and he/she can either refill your medications or you can return here when your prescription runs out.     If in any way you are not 100% satisfied,  please tell us.  If 100% better, tell your friends!

## 2011-06-10 NOTE — Assessment & Plan Note (Signed)
Classic Upper airway cough syndrome, so named because it's frequently impossible to sort out how much is  CR/sinusitis with freq throat clearing (which can be related to primary GERD)   vs  causing  secondary (" extra esophageal")  GERD from wide swings in gastric pressure that occur with throat clearing, often  promoting self use of mint and menthol lozenges that reduce the lower esophageal sphincter tone and exacerbate the problem further in a cyclical fashion.   These are the same pts who not infrequently have failed to tolerate ace inhibitors,  dry powder inhalers or biphosphonates or report having reflux symptoms that don't respond to standard doses of PPI , and are easily confused as having aecopd or asthma flares,   Explained natural history of uri and why it's necessary in patients at risk to treat GERD aggressively  at least  short term   to reduce risk of evolving cyclical cough initially  triggered by epithelial injury and a heightened sensitivty to the effects of any upper airway irritants,  most importantly acid - related.  That is, the more sensitive the epithelium damaged for virus, the more the cough, the more the secondary reflux (especially in those prone to reflux) the more the irritation of the sensitive mucosa and so on in a cyclical pattern.   See instructions for specific recommendations which were reviewed directly with the patient who was given a copy with highlighter outlining the key components.

## 2011-06-11 ENCOUNTER — Telehealth: Payer: Self-pay | Admitting: Internal Medicine

## 2011-06-11 NOTE — Telephone Encounter (Signed)
It's probably the tramadol - should definitely start the prednisone now

## 2011-06-11 NOTE — Telephone Encounter (Signed)
Pt's spouse says the pt took Tramadol and the Pepcid last night and has had dry heaves all morning. He has not started the Prednisone because of this. He wants to know if he should stop one or both of these medications. Pls advise. Allergies  Allergen Reactions  . Penicillins Hives

## 2011-06-11 NOTE — Telephone Encounter (Signed)
Pt wife aware.Shanyia Stines, CMA  

## 2011-06-13 ENCOUNTER — Encounter: Payer: Self-pay | Admitting: Internal Medicine

## 2011-07-30 ENCOUNTER — Encounter: Payer: Self-pay | Admitting: Internal Medicine

## 2011-08-19 ENCOUNTER — Encounter: Payer: Self-pay | Admitting: *Deleted

## 2011-08-21 ENCOUNTER — Ambulatory Visit (INDEPENDENT_AMBULATORY_CARE_PROVIDER_SITE_OTHER): Payer: Medicare Other | Admitting: Internal Medicine

## 2011-08-21 ENCOUNTER — Encounter: Payer: Self-pay | Admitting: Internal Medicine

## 2011-08-21 VITALS — BP 138/62 | HR 80 | Ht 73.0 in | Wt 186.0 lb

## 2011-08-21 DIAGNOSIS — K219 Gastro-esophageal reflux disease without esophagitis: Secondary | ICD-10-CM

## 2011-08-21 DIAGNOSIS — K222 Esophageal obstruction: Secondary | ICD-10-CM

## 2011-08-21 DIAGNOSIS — R141 Gas pain: Secondary | ICD-10-CM

## 2011-08-21 DIAGNOSIS — R143 Flatulence: Secondary | ICD-10-CM

## 2011-08-21 DIAGNOSIS — Z86010 Personal history of colonic polyps: Secondary | ICD-10-CM

## 2011-08-21 MED ORDER — ALIGN PO CAPS: 1.0000 | ORAL_CAPSULE | Freq: Every day | ORAL | Status: AC

## 2011-08-21 NOTE — Patient Instructions (Signed)
We have given you samples of Align. This puts good bacteria back into your intestines. You should take 1 capsule by mouth once daily. If this works well for you, it can be purchased over the counter.  Please follow up with Dr. Marina Goodell in 18 months

## 2011-08-24 ENCOUNTER — Encounter: Payer: Self-pay | Admitting: Internal Medicine

## 2011-08-24 NOTE — Progress Notes (Signed)
HISTORY OF PRESENT ILLNESS:  Todd Benton is a 77 y.o. male with the below listed medical history. Followed in this office for GERD complicated by peptic stricture, adenomatous colon polyps, and chronic complaints of increased intestinal gas. Presents today regarding GERD follow up. Continues on PPI. No recurrent dysphagia. Recent problems with cough have improved. Continues to c/o increased gas. +/- help with probiotic.   REVIEW OF SYSTEMS:  All non-GI ROS negative except for  Past Medical History  Diagnosis Date  . Thyroid disease   . Hypertension   . Hyperlipidemia   . Coronary artery disease     nonobstuctive  . Premature ventricular contractions     frequent  . Ventricular tachycardia     nonsustained  . Hx of adenomatous colonic polyps   . GERD (gastroesophageal reflux disease)   . Diverticulosis   . Hiatal hernia   . External hemorrhoid   . Peptic stricture of esophagus     Past Surgical History  Procedure Date  . Tonsillectomy     at age 18  . Cardiac catheterization 12/15/2008    EF50%/nonobstuctive coronary arteries/lt ventricular systolic function is at the lower limits of normal/segmental wall motion abnormalities probably due to catheter-induced ectopy/no severe CAD to explain chest pain  . Cardiac catheterization 12/20/1999    EF65/70%/normal coronary arteries with only minor luminal irregularities consistent with someone 77 yrs old/normal lt ventricular systolic function  . Cataract extraction     Social History Todd Benton  reports that he has never smoked. He has never used smokeless tobacco. He reports that he does not drink alcohol or use illicit drugs.  family history includes Alzheimer's disease in his brother; Heart attack in his mother; Heart attack (age of onset:62) in his father; Lymphoma (age of onset:8) in his son; and Stroke in his mother.  There is no history of Colon cancer.  Allergies  Allergen Reactions  . Penicillins Hives        PHYSICAL EXAMINATION: Vital signs: BP 138/62  Pulse 80  Ht 6\' 1"  (1.854 m)  Wt 84.369 kg (186 lb)  BMI 24.54 kg/m2 General: Well-developed, well-nourished, no acute distress HEENT: Sclerae are anicteric, conjunctiva pink. Oral mucosa intact Lungs: Clear Heart: Regular Abdomen: soft, nontender, nondistended, no obvious ascites, no peritoneal signs, normal bowel sounds. No organomegaly. Extremities: No edema Psychiatric: alert and oriented x3. Cooperative     ASSESSMENT:  1. GERD 2. History of peptic stricture requiring dilation. No recurrent dysphagia 3. Increased intestinal gas 4. History of adenomatous polyps. Due for follow up 12-2012   PLAN:  1. Reflux precautions 2. Continue PPI 3. Esophageal dilation prn  4. Anti-gas measures reviewed 5. Probiotic Align one daily for 2-3 weeks (samples given). Can use on demand if helpful 6. Surveillance colonoscopy 12-2012

## 2012-01-05 DIAGNOSIS — E785 Hyperlipidemia, unspecified: Secondary | ICD-10-CM | POA: Diagnosis not present

## 2012-01-05 DIAGNOSIS — E039 Hypothyroidism, unspecified: Secondary | ICD-10-CM | POA: Diagnosis not present

## 2012-01-05 DIAGNOSIS — Z1211 Encounter for screening for malignant neoplasm of colon: Secondary | ICD-10-CM | POA: Diagnosis not present

## 2012-01-05 DIAGNOSIS — Z0000 Encounter for general adult medical examination without abnormal findings: Secondary | ICD-10-CM | POA: Diagnosis not present

## 2012-01-05 DIAGNOSIS — N400 Enlarged prostate without lower urinary tract symptoms: Secondary | ICD-10-CM | POA: Diagnosis not present

## 2012-01-05 DIAGNOSIS — Z125 Encounter for screening for malignant neoplasm of prostate: Secondary | ICD-10-CM | POA: Diagnosis not present

## 2012-01-05 DIAGNOSIS — K219 Gastro-esophageal reflux disease without esophagitis: Secondary | ICD-10-CM | POA: Diagnosis not present

## 2012-01-05 DIAGNOSIS — I10 Essential (primary) hypertension: Secondary | ICD-10-CM | POA: Diagnosis not present

## 2012-01-08 DIAGNOSIS — H35359 Cystoid macular degeneration, unspecified eye: Secondary | ICD-10-CM | POA: Diagnosis not present

## 2012-01-08 DIAGNOSIS — H35369 Drusen (degenerative) of macula, unspecified eye: Secondary | ICD-10-CM | POA: Diagnosis not present

## 2012-01-08 DIAGNOSIS — H43399 Other vitreous opacities, unspecified eye: Secondary | ICD-10-CM | POA: Diagnosis not present

## 2012-03-01 ENCOUNTER — Other Ambulatory Visit: Payer: Self-pay

## 2012-03-01 MED ORDER — LANSOPRAZOLE 30 MG PO CPDR: 30.0000 mg | DELAYED_RELEASE_CAPSULE | Freq: Every day | ORAL | Status: DC

## 2012-07-01 DIAGNOSIS — H353190 Nonexudative age-related macular degeneration, unspecified eye, stage unspecified: Secondary | ICD-10-CM | POA: Diagnosis not present

## 2012-07-01 DIAGNOSIS — H43399 Other vitreous opacities, unspecified eye: Secondary | ICD-10-CM | POA: Diagnosis not present

## 2012-07-01 DIAGNOSIS — H35369 Drusen (degenerative) of macula, unspecified eye: Secondary | ICD-10-CM | POA: Diagnosis not present

## 2012-07-01 DIAGNOSIS — H43819 Vitreous degeneration, unspecified eye: Secondary | ICD-10-CM | POA: Diagnosis not present

## 2012-07-02 DIAGNOSIS — Z23 Encounter for immunization: Secondary | ICD-10-CM | POA: Diagnosis not present

## 2012-07-29 DIAGNOSIS — Z961 Presence of intraocular lens: Secondary | ICD-10-CM | POA: Diagnosis not present

## 2012-07-29 DIAGNOSIS — H01009 Unspecified blepharitis unspecified eye, unspecified eyelid: Secondary | ICD-10-CM | POA: Diagnosis not present

## 2012-07-29 DIAGNOSIS — H40059 Ocular hypertension, unspecified eye: Secondary | ICD-10-CM | POA: Diagnosis not present

## 2012-07-29 DIAGNOSIS — H3530 Unspecified macular degeneration: Secondary | ICD-10-CM | POA: Diagnosis not present

## 2012-08-23 DIAGNOSIS — J209 Acute bronchitis, unspecified: Secondary | ICD-10-CM | POA: Diagnosis not present

## 2012-12-13 ENCOUNTER — Telehealth: Payer: Self-pay | Admitting: Internal Medicine

## 2012-12-13 MED ORDER — LANSOPRAZOLE 30 MG PO CPDR: 30.0000 mg | DELAYED_RELEASE_CAPSULE | Freq: Every day | ORAL | Status: DC

## 2012-12-13 NOTE — Telephone Encounter (Signed)
Refilled Prevacid

## 2012-12-21 ENCOUNTER — Ambulatory Visit: Payer: Medicare Other | Admitting: Internal Medicine

## 2012-12-28 DIAGNOSIS — H43399 Other vitreous opacities, unspecified eye: Secondary | ICD-10-CM | POA: Diagnosis not present

## 2012-12-28 DIAGNOSIS — H353190 Nonexudative age-related macular degeneration, unspecified eye, stage unspecified: Secondary | ICD-10-CM | POA: Diagnosis not present

## 2012-12-28 DIAGNOSIS — H35369 Drusen (degenerative) of macula, unspecified eye: Secondary | ICD-10-CM | POA: Diagnosis not present

## 2012-12-28 DIAGNOSIS — H35359 Cystoid macular degeneration, unspecified eye: Secondary | ICD-10-CM | POA: Diagnosis not present

## 2012-12-29 ENCOUNTER — Encounter: Payer: Self-pay | Admitting: Internal Medicine

## 2012-12-29 ENCOUNTER — Ambulatory Visit (INDEPENDENT_AMBULATORY_CARE_PROVIDER_SITE_OTHER): Payer: Medicare Other | Admitting: Internal Medicine

## 2012-12-29 VITALS — BP 150/80 | HR 86 | Ht 73.0 in | Wt 188.0 lb

## 2012-12-29 DIAGNOSIS — K219 Gastro-esophageal reflux disease without esophagitis: Secondary | ICD-10-CM | POA: Diagnosis not present

## 2012-12-29 DIAGNOSIS — R141 Gas pain: Secondary | ICD-10-CM | POA: Diagnosis not present

## 2012-12-29 DIAGNOSIS — R142 Eructation: Secondary | ICD-10-CM

## 2012-12-29 DIAGNOSIS — R143 Flatulence: Secondary | ICD-10-CM | POA: Diagnosis not present

## 2012-12-29 DIAGNOSIS — A09 Infectious gastroenteritis and colitis, unspecified: Secondary | ICD-10-CM | POA: Diagnosis not present

## 2012-12-29 DIAGNOSIS — Z86010 Personal history of colonic polyps: Secondary | ICD-10-CM | POA: Diagnosis not present

## 2012-12-29 MED ORDER — HYOSCYAMINE SULFATE 0.125 MG SL SUBL: SUBLINGUAL_TABLET | SUBLINGUAL | Status: DC

## 2012-12-29 NOTE — Patient Instructions (Addendum)
We have sent the following medications to your pharmacy for you to pick up at your convenience:  Levsin  Please follow up with Dr. Perry as needed.  

## 2012-12-29 NOTE — Progress Notes (Signed)
HISTORY OF PRESENT ILLNESS:  Todd Benton is a 79 y.o. male with multiple medical problems including hypertension, hyperlipidemia, and hypothyroidism. He is followed in this office for GERD and a history of colon polyps. He presents today for a number of reasons. First, to discuss surveillance colonoscopy. Next, recent problems with nausea vomiting and diarrhea. Finally, ongoing problems with intestinal gas. Patient has undergone multiple colonoscopies previously. His last complete colonoscopy was in March of 2009. No polyps. He has had no documented adenomatous polyps in 20 years. No family history of colon cancer. He denies rectal bleeding or weight loss. He had been undergoing colonoscopy about every 5 years and felt that he needed a followup colonoscopy at this time. Next, one week ago, he developed multiple episodes of nausea and vomiting. That subsided overnight. The next day fatigue followed by loose stools. This has resolved without recurrence. He continues to complain of increased intestinal gas. Bloating and flatus. About once per month his results and abdominal cramping that lasts throughout the evening and abates my morning after somewhat explosive bowel movement. He's been on multiple therapies previously without improvement. We have multiple discussions on the clinical significance of intestinal gas. His GI review of systems is otherwise negative. His GERD symptoms remain controlled with once daily PPI. No dysphagia.  REVIEW OF SYSTEMS:  All non-GI ROS negative after review  Past Medical History  Diagnosis Date  . Thyroid disease   . Hypertension   . Hyperlipidemia   . Coronary artery disease     nonobstuctive  . Premature ventricular contractions     frequent  . Ventricular tachycardia     nonsustained  . Hx of adenomatous colonic polyps   . GERD (gastroesophageal reflux disease)   . Diverticulosis   . Hiatal hernia   . External hemorrhoid   . Peptic stricture of esophagus    . Macular degeneration     Past Surgical History  Procedure Laterality Date  . Tonsillectomy      at age 21  . Cardiac catheterization  12/15/2008    EF50%/nonobstuctive coronary arteries/lt ventricular systolic function is at the lower limits of normal/segmental wall motion abnormalities probably due to catheter-induced ectopy/no severe CAD to explain chest pain  . Cardiac catheterization  12/20/1999    EF65/70%/normal coronary arteries with only minor luminal irregularities consistent with someone 79 yrs old/normal lt ventricular systolic function  . Cataract extraction      Social History Todd Benton  reports that he has never smoked. He has never used smokeless tobacco. He reports that he does not drink alcohol or use illicit drugs.  family history includes Alzheimer's disease in his brother; Heart attack in his mother; Heart attack (age of onset: 75) in his father; Lymphoma (age of onset: 102) in his son; and Stroke in his mother.  There is no history of Colon cancer.  Allergies  Allergen Reactions  . Penicillins Hives       PHYSICAL EXAMINATION: Vital signs: BP 150/80  Pulse 86  Ht 6\' 1"  (1.854 m)  Wt 188 lb (85.276 kg)  BMI 24.81 kg/m2 General: Well-developed, well-nourished, no acute distress HEENT: Sclerae are anicteric, conjunctiva pink. Oral mucosa intact Lungs: Clear Heart: Regular Abdomen: soft, nontender, nondistended, no obvious ascites, no peritoneal signs, normal bowel sounds. No organomegaly. Extremities: No edema Psychiatric: alert and oriented x3. Cooperative    ASSESSMENT:  #1. Recent problems with acute nausea, vomiting, and diarrhea. Now resolved. Problem most compatible with self-limiting infectious gastroenteritis. #2. Increased  intestinal gas. Ongoing. Cramping lower abdominal discomfort once monthly is problematic for him #3. Remote history of adenomatous colon polyp. Nothing in recent decades. No relevance symptoms concerning for colorectal  neoplasia. Age 69.   PLAN:  #1. Expectant management #2. Discussion on intestinal gas (again). #3. Prescribe Levsin sublingual 0.25 mg, 1-2 sublingual or by mouth every 4 hours as needed for lower abdominal cramping pain #4. No indication for surveillance colonoscopy based on his history, age, and current guidelines. Reviewed in detail. #5. Resume general medical care with PCP

## 2013-01-19 ENCOUNTER — Encounter: Payer: Medicare Other | Admitting: Internal Medicine

## 2013-03-08 ENCOUNTER — Other Ambulatory Visit: Payer: Self-pay | Admitting: Internal Medicine

## 2013-04-07 DIAGNOSIS — N400 Enlarged prostate without lower urinary tract symptoms: Secondary | ICD-10-CM | POA: Diagnosis not present

## 2013-04-07 DIAGNOSIS — R259 Unspecified abnormal involuntary movements: Secondary | ICD-10-CM | POA: Diagnosis not present

## 2013-04-07 DIAGNOSIS — E039 Hypothyroidism, unspecified: Secondary | ICD-10-CM | POA: Diagnosis not present

## 2013-04-07 DIAGNOSIS — I10 Essential (primary) hypertension: Secondary | ICD-10-CM | POA: Diagnosis not present

## 2013-04-07 DIAGNOSIS — Z0000 Encounter for general adult medical examination without abnormal findings: Secondary | ICD-10-CM | POA: Diagnosis not present

## 2013-04-07 DIAGNOSIS — E785 Hyperlipidemia, unspecified: Secondary | ICD-10-CM | POA: Diagnosis not present

## 2013-04-07 DIAGNOSIS — K219 Gastro-esophageal reflux disease without esophagitis: Secondary | ICD-10-CM | POA: Diagnosis not present

## 2013-04-07 DIAGNOSIS — R142 Eructation: Secondary | ICD-10-CM | POA: Diagnosis not present

## 2013-04-07 DIAGNOSIS — Z1331 Encounter for screening for depression: Secondary | ICD-10-CM | POA: Diagnosis not present

## 2013-06-09 ENCOUNTER — Other Ambulatory Visit: Payer: Self-pay | Admitting: Internal Medicine

## 2013-07-04 DIAGNOSIS — H35349 Macular cyst, hole, or pseudohole, unspecified eye: Secondary | ICD-10-CM | POA: Diagnosis not present

## 2013-07-04 DIAGNOSIS — H353190 Nonexudative age-related macular degeneration, unspecified eye, stage unspecified: Secondary | ICD-10-CM | POA: Diagnosis not present

## 2013-07-04 DIAGNOSIS — H35369 Drusen (degenerative) of macula, unspecified eye: Secondary | ICD-10-CM | POA: Diagnosis not present

## 2013-07-21 DIAGNOSIS — Z23 Encounter for immunization: Secondary | ICD-10-CM | POA: Diagnosis not present

## 2013-08-04 DIAGNOSIS — H43819 Vitreous degeneration, unspecified eye: Secondary | ICD-10-CM | POA: Diagnosis not present

## 2013-08-04 DIAGNOSIS — Z961 Presence of intraocular lens: Secondary | ICD-10-CM | POA: Diagnosis not present

## 2013-08-04 DIAGNOSIS — H02839 Dermatochalasis of unspecified eye, unspecified eyelid: Secondary | ICD-10-CM | POA: Diagnosis not present

## 2013-08-04 DIAGNOSIS — H40059 Ocular hypertension, unspecified eye: Secondary | ICD-10-CM | POA: Diagnosis not present

## 2013-08-04 DIAGNOSIS — H353190 Nonexudative age-related macular degeneration, unspecified eye, stage unspecified: Secondary | ICD-10-CM | POA: Diagnosis not present

## 2013-08-18 ENCOUNTER — Other Ambulatory Visit: Payer: Self-pay | Admitting: Dermatology

## 2013-08-18 DIAGNOSIS — C44211 Basal cell carcinoma of skin of unspecified ear and external auricular canal: Secondary | ICD-10-CM | POA: Diagnosis not present

## 2013-08-18 DIAGNOSIS — D239 Other benign neoplasm of skin, unspecified: Secondary | ICD-10-CM | POA: Diagnosis not present

## 2013-08-18 DIAGNOSIS — L821 Other seborrheic keratosis: Secondary | ICD-10-CM | POA: Diagnosis not present

## 2013-09-12 DIAGNOSIS — C44211 Basal cell carcinoma of skin of unspecified ear and external auricular canal: Secondary | ICD-10-CM | POA: Diagnosis not present

## 2013-09-12 DIAGNOSIS — Z85828 Personal history of other malignant neoplasm of skin: Secondary | ICD-10-CM | POA: Diagnosis not present

## 2013-09-19 ENCOUNTER — Telehealth: Payer: Self-pay | Admitting: Internal Medicine

## 2013-09-19 MED ORDER — LANSOPRAZOLE 30 MG PO CPDR: 30.0000 mg | DELAYED_RELEASE_CAPSULE | Freq: Every day | ORAL | Status: DC

## 2013-09-19 NOTE — Telephone Encounter (Signed)
Refilled Prevacid

## 2013-10-03 DIAGNOSIS — K219 Gastro-esophageal reflux disease without esophagitis: Secondary | ICD-10-CM | POA: Diagnosis not present

## 2013-10-03 DIAGNOSIS — R7309 Other abnormal glucose: Secondary | ICD-10-CM | POA: Diagnosis not present

## 2013-10-03 DIAGNOSIS — E785 Hyperlipidemia, unspecified: Secondary | ICD-10-CM | POA: Diagnosis not present

## 2013-10-03 DIAGNOSIS — E039 Hypothyroidism, unspecified: Secondary | ICD-10-CM | POA: Diagnosis not present

## 2013-10-03 DIAGNOSIS — I10 Essential (primary) hypertension: Secondary | ICD-10-CM | POA: Diagnosis not present

## 2013-12-20 ENCOUNTER — Telehealth: Payer: Self-pay | Admitting: Internal Medicine

## 2013-12-20 MED ORDER — LANSOPRAZOLE 30 MG PO CPDR: 30.0000 mg | DELAYED_RELEASE_CAPSULE | Freq: Every day | ORAL | Status: DC

## 2013-12-20 NOTE — Telephone Encounter (Signed)
Rx sent. Patient advised. 

## 2014-01-02 DIAGNOSIS — H353190 Nonexudative age-related macular degeneration, unspecified eye, stage unspecified: Secondary | ICD-10-CM | POA: Diagnosis not present

## 2014-01-02 DIAGNOSIS — H35369 Drusen (degenerative) of macula, unspecified eye: Secondary | ICD-10-CM | POA: Diagnosis not present

## 2014-01-02 DIAGNOSIS — H353290 Exudative age-related macular degeneration, unspecified eye, stage unspecified: Secondary | ICD-10-CM | POA: Diagnosis not present

## 2014-03-24 DIAGNOSIS — D239 Other benign neoplasm of skin, unspecified: Secondary | ICD-10-CM | POA: Diagnosis not present

## 2014-03-24 DIAGNOSIS — Z85828 Personal history of other malignant neoplasm of skin: Secondary | ICD-10-CM | POA: Diagnosis not present

## 2014-03-24 DIAGNOSIS — D219 Benign neoplasm of connective and other soft tissue, unspecified: Secondary | ICD-10-CM | POA: Diagnosis not present

## 2014-03-24 DIAGNOSIS — L819 Disorder of pigmentation, unspecified: Secondary | ICD-10-CM | POA: Diagnosis not present

## 2014-03-24 DIAGNOSIS — D1801 Hemangioma of skin and subcutaneous tissue: Secondary | ICD-10-CM | POA: Diagnosis not present

## 2014-03-24 DIAGNOSIS — L821 Other seborrheic keratosis: Secondary | ICD-10-CM | POA: Diagnosis not present

## 2014-04-10 DIAGNOSIS — R143 Flatulence: Secondary | ICD-10-CM | POA: Diagnosis not present

## 2014-04-10 DIAGNOSIS — E785 Hyperlipidemia, unspecified: Secondary | ICD-10-CM | POA: Diagnosis not present

## 2014-04-10 DIAGNOSIS — E039 Hypothyroidism, unspecified: Secondary | ICD-10-CM | POA: Diagnosis not present

## 2014-04-10 DIAGNOSIS — R259 Unspecified abnormal involuntary movements: Secondary | ICD-10-CM | POA: Diagnosis not present

## 2014-04-10 DIAGNOSIS — Z0000 Encounter for general adult medical examination without abnormal findings: Secondary | ICD-10-CM | POA: Diagnosis not present

## 2014-04-10 DIAGNOSIS — Z125 Encounter for screening for malignant neoplasm of prostate: Secondary | ICD-10-CM | POA: Diagnosis not present

## 2014-04-10 DIAGNOSIS — N400 Enlarged prostate without lower urinary tract symptoms: Secondary | ICD-10-CM | POA: Diagnosis not present

## 2014-04-10 DIAGNOSIS — R7309 Other abnormal glucose: Secondary | ICD-10-CM | POA: Diagnosis not present

## 2014-04-10 DIAGNOSIS — R141 Gas pain: Secondary | ICD-10-CM | POA: Diagnosis not present

## 2014-04-10 DIAGNOSIS — Z23 Encounter for immunization: Secondary | ICD-10-CM | POA: Diagnosis not present

## 2014-04-10 DIAGNOSIS — Z1331 Encounter for screening for depression: Secondary | ICD-10-CM | POA: Diagnosis not present

## 2014-04-10 DIAGNOSIS — I10 Essential (primary) hypertension: Secondary | ICD-10-CM | POA: Diagnosis not present

## 2014-07-04 DIAGNOSIS — Z23 Encounter for immunization: Secondary | ICD-10-CM | POA: Diagnosis not present

## 2014-08-10 DIAGNOSIS — H02834 Dermatochalasis of left upper eyelid: Secondary | ICD-10-CM | POA: Diagnosis not present

## 2014-08-10 DIAGNOSIS — H02831 Dermatochalasis of right upper eyelid: Secondary | ICD-10-CM | POA: Diagnosis not present

## 2014-08-10 DIAGNOSIS — H40053 Ocular hypertension, bilateral: Secondary | ICD-10-CM | POA: Diagnosis not present

## 2014-08-10 DIAGNOSIS — Z961 Presence of intraocular lens: Secondary | ICD-10-CM | POA: Diagnosis not present

## 2014-08-10 DIAGNOSIS — H3531 Nonexudative age-related macular degeneration: Secondary | ICD-10-CM | POA: Diagnosis not present

## 2014-09-12 ENCOUNTER — Other Ambulatory Visit: Payer: Self-pay | Admitting: Internal Medicine

## 2014-10-03 DIAGNOSIS — R251 Tremor, unspecified: Secondary | ICD-10-CM | POA: Diagnosis not present

## 2014-10-03 DIAGNOSIS — R7309 Other abnormal glucose: Secondary | ICD-10-CM | POA: Diagnosis not present

## 2014-10-03 DIAGNOSIS — E039 Hypothyroidism, unspecified: Secondary | ICD-10-CM | POA: Diagnosis not present

## 2014-10-03 DIAGNOSIS — K219 Gastro-esophageal reflux disease without esophagitis: Secondary | ICD-10-CM | POA: Diagnosis not present

## 2014-10-03 DIAGNOSIS — E785 Hyperlipidemia, unspecified: Secondary | ICD-10-CM | POA: Diagnosis not present

## 2014-10-03 DIAGNOSIS — I10 Essential (primary) hypertension: Secondary | ICD-10-CM | POA: Diagnosis not present

## 2014-11-07 DIAGNOSIS — H3531 Nonexudative age-related macular degeneration: Secondary | ICD-10-CM | POA: Diagnosis not present

## 2014-11-07 DIAGNOSIS — H35363 Drusen (degenerative) of macula, bilateral: Secondary | ICD-10-CM | POA: Diagnosis not present

## 2014-11-07 DIAGNOSIS — H43393 Other vitreous opacities, bilateral: Secondary | ICD-10-CM | POA: Diagnosis not present

## 2014-11-07 DIAGNOSIS — H35341 Macular cyst, hole, or pseudohole, right eye: Secondary | ICD-10-CM | POA: Diagnosis not present

## 2014-11-15 DIAGNOSIS — H43821 Vitreomacular adhesion, right eye: Secondary | ICD-10-CM | POA: Diagnosis not present

## 2014-11-15 DIAGNOSIS — H35341 Macular cyst, hole, or pseudohole, right eye: Secondary | ICD-10-CM | POA: Diagnosis not present

## 2014-11-23 DIAGNOSIS — H35341 Macular cyst, hole, or pseudohole, right eye: Secondary | ICD-10-CM | POA: Diagnosis not present

## 2014-12-18 DIAGNOSIS — L259 Unspecified contact dermatitis, unspecified cause: Secondary | ICD-10-CM | POA: Diagnosis not present

## 2014-12-18 DIAGNOSIS — J069 Acute upper respiratory infection, unspecified: Secondary | ICD-10-CM | POA: Diagnosis not present

## 2015-01-10 DIAGNOSIS — Z09 Encounter for follow-up examination after completed treatment for conditions other than malignant neoplasm: Secondary | ICD-10-CM | POA: Diagnosis not present

## 2015-01-10 DIAGNOSIS — H35341 Macular cyst, hole, or pseudohole, right eye: Secondary | ICD-10-CM | POA: Diagnosis not present

## 2015-03-31 ENCOUNTER — Other Ambulatory Visit: Payer: Self-pay | Admitting: Internal Medicine

## 2015-04-05 DIAGNOSIS — H35341 Macular cyst, hole, or pseudohole, right eye: Secondary | ICD-10-CM | POA: Diagnosis not present

## 2015-05-03 DIAGNOSIS — K219 Gastro-esophageal reflux disease without esophagitis: Secondary | ICD-10-CM | POA: Diagnosis not present

## 2015-05-03 DIAGNOSIS — Z0000 Encounter for general adult medical examination without abnormal findings: Secondary | ICD-10-CM | POA: Diagnosis not present

## 2015-05-03 DIAGNOSIS — E039 Hypothyroidism, unspecified: Secondary | ICD-10-CM | POA: Diagnosis not present

## 2015-05-03 DIAGNOSIS — Z1389 Encounter for screening for other disorder: Secondary | ICD-10-CM | POA: Diagnosis not present

## 2015-05-03 DIAGNOSIS — R7309 Other abnormal glucose: Secondary | ICD-10-CM | POA: Diagnosis not present

## 2015-05-03 DIAGNOSIS — R251 Tremor, unspecified: Secondary | ICD-10-CM | POA: Diagnosis not present

## 2015-05-03 DIAGNOSIS — E785 Hyperlipidemia, unspecified: Secondary | ICD-10-CM | POA: Diagnosis not present

## 2015-05-03 DIAGNOSIS — Z125 Encounter for screening for malignant neoplasm of prostate: Secondary | ICD-10-CM | POA: Diagnosis not present

## 2015-05-03 DIAGNOSIS — I10 Essential (primary) hypertension: Secondary | ICD-10-CM | POA: Diagnosis not present

## 2015-06-28 ENCOUNTER — Other Ambulatory Visit: Payer: Self-pay | Admitting: Internal Medicine

## 2015-07-10 DIAGNOSIS — Z23 Encounter for immunization: Secondary | ICD-10-CM | POA: Diagnosis not present

## 2015-08-16 DIAGNOSIS — H353131 Nonexudative age-related macular degeneration, bilateral, early dry stage: Secondary | ICD-10-CM | POA: Diagnosis not present

## 2015-08-16 DIAGNOSIS — H02834 Dermatochalasis of left upper eyelid: Secondary | ICD-10-CM | POA: Diagnosis not present

## 2015-08-16 DIAGNOSIS — H35371 Puckering of macula, right eye: Secondary | ICD-10-CM | POA: Diagnosis not present

## 2015-08-16 DIAGNOSIS — H40053 Ocular hypertension, bilateral: Secondary | ICD-10-CM | POA: Diagnosis not present

## 2015-08-16 DIAGNOSIS — Z961 Presence of intraocular lens: Secondary | ICD-10-CM | POA: Diagnosis not present

## 2015-08-16 DIAGNOSIS — H02831 Dermatochalasis of right upper eyelid: Secondary | ICD-10-CM | POA: Diagnosis not present

## 2015-08-16 DIAGNOSIS — H35341 Macular cyst, hole, or pseudohole, right eye: Secondary | ICD-10-CM | POA: Diagnosis not present

## 2015-08-20 ENCOUNTER — Encounter: Payer: Self-pay | Admitting: Internal Medicine

## 2015-08-20 ENCOUNTER — Ambulatory Visit (INDEPENDENT_AMBULATORY_CARE_PROVIDER_SITE_OTHER): Payer: Medicare Other | Admitting: Internal Medicine

## 2015-08-20 VITALS — BP 162/60 | HR 76 | Ht 73.0 in | Wt 188.8 lb

## 2015-08-20 DIAGNOSIS — R194 Change in bowel habit: Secondary | ICD-10-CM | POA: Diagnosis not present

## 2015-08-20 DIAGNOSIS — K219 Gastro-esophageal reflux disease without esophagitis: Secondary | ICD-10-CM | POA: Diagnosis not present

## 2015-08-20 DIAGNOSIS — K222 Esophageal obstruction: Secondary | ICD-10-CM | POA: Diagnosis not present

## 2015-08-20 DIAGNOSIS — R143 Flatulence: Secondary | ICD-10-CM | POA: Diagnosis not present

## 2015-08-20 MED ORDER — LANSOPRAZOLE 30 MG PO CPDR: DELAYED_RELEASE_CAPSULE | ORAL | Status: DC

## 2015-08-20 NOTE — Progress Notes (Signed)
HISTORY OF PRESENT ILLNESS:  Todd Benton is a 81 y.o. male with past medical history as listed below. He is followed in this office for GERD with peptic stricture requiring dilation, history of colon polyps, and chronic complaints of gas. He presents today with chief complaint of managing GERD, refill of medication, change in bowel habits, and gas. He was last evaluated March 2014 at which time he was felt to have self-limited gastritis, chronic increased intestinal gas with cramping, and aged out of colon cancer surveillance. He was prescribed sublingual Levsin and continued on lansoprazole for his GERD. First, patient reports ongoing use of lansoprazole without reflux symptoms. No recurrence of dysphagia since his remote esophageal dilation. He did read a newspaper article questioning potential side effects of long-term PPI use, particularly dementia. He continues to have problems with gas, though less lower abdominal cramping. He does notice a change in bowel habits greater than one year with intermittent loose stools without incontinence, bleeding, or weight loss. His GI review of systems is otherwise negative  REVIEW OF SYSTEMS:  All non-GI ROS negative except for cough  Past Medical History  Diagnosis Date  . Thyroid disease   . Hypertension   . Hyperlipidemia   . Coronary artery disease     nonobstuctive  . Premature ventricular contractions     frequent  . Ventricular tachycardia (HCC)     nonsustained  . Hx of adenomatous colonic polyps   . GERD (gastroesophageal reflux disease)   . Diverticulosis   . Hiatal hernia   . External hemorrhoid   . Peptic stricture of esophagus   . Macular degeneration     Past Surgical History  Procedure Laterality Date  . Tonsillectomy      at age 17  . Cardiac catheterization  12/15/2008    EF50%/nonobstuctive coronary arteries/lt ventricular systolic function is at the lower limits of normal/segmental wall motion abnormalities probably due  to catheter-induced ectopy/no severe CAD to explain chest pain  . Cardiac catheterization  12/20/1999    EF65/70%/normal coronary arteries with only minor luminal irregularities consistent with someone 81 yrs old/normal lt ventricular systolic function  . Cataract extraction Bilateral   . Eye surgery Right     Social History [name]  reports that he has never smoked. He has never used smokeless tobacco. He reports that he does not drink alcohol or use illicit drugs.  family history includes Alzheimer's disease in his brother; Heart attack in his mother; Heart attack (age of onset: 4) in his father; Lymphoma (age of onset: 42) in his son; Stroke in his mother. There is no history of Colon cancer.  Allergies  Allergen Reactions  . Penicillins Hives       PHYSICAL EXAMINATION: Vital signs: BP 162/60 mmHg  Pulse 76  Ht 6\' 1"  (1.854 m)  Wt 188 lb 12.8 oz (85.639 kg)  BMI 24.91 kg/m2 General: Well-developed, well-nourished elderly male, no acute distress HEENT: Sclerae are anicteric, conjunctiva pink. Oral mucosa intact Lungs: Clear Heart: Regular Abdomen: soft, nontender, nondistended, no obvious ascites, no peritoneal signs, normal bowel sounds. No organomegaly. Extremities: No clubbing cyanosis or edema Psychiatric: alert and oriented x3. Cooperative   ASSESSMENT:  #1. GERD. Asymptomatic on PPI #2. History of peptic stricture requiring esophageal dilation. No recurrent dysphagia post dilation on PPI #3. Increased intestinal gas. Ongoing without change #4. Nonspecific change in bowel habits as described. No alarm features  PLAN:  #1. Reflux precautions #2. Discussed available knowledge regarding long-term PPI use  and potential side effects. No definitive call relationship between PPI and dementia yet established #3. Refill lansoprazole 30 mg daily. Prescribed #4. Discussion on gas #5. Recommend trial of probiotic #6. Routine office follow-up in 1-2 years. Sooner if  needed  25 minutes was spent face-to-face with the patient. The majority of the time was spent counseling him regarding his GI diagnoses and complaints.

## 2015-10-04 DIAGNOSIS — H35341 Macular cyst, hole, or pseudohole, right eye: Secondary | ICD-10-CM | POA: Diagnosis not present

## 2015-10-04 DIAGNOSIS — H35363 Drusen (degenerative) of macula, bilateral: Secondary | ICD-10-CM | POA: Diagnosis not present

## 2015-10-04 DIAGNOSIS — H43392 Other vitreous opacities, left eye: Secondary | ICD-10-CM | POA: Diagnosis not present

## 2015-10-04 DIAGNOSIS — H353132 Nonexudative age-related macular degeneration, bilateral, intermediate dry stage: Secondary | ICD-10-CM | POA: Diagnosis not present

## 2016-09-17 ENCOUNTER — Other Ambulatory Visit: Payer: Self-pay | Admitting: Internal Medicine

## 2016-10-02 DIAGNOSIS — H35363 Drusen (degenerative) of macula, bilateral: Secondary | ICD-10-CM | POA: Diagnosis not present

## 2016-10-02 DIAGNOSIS — H35341 Macular cyst, hole, or pseudohole, right eye: Secondary | ICD-10-CM | POA: Diagnosis not present

## 2016-10-02 DIAGNOSIS — H353132 Nonexudative age-related macular degeneration, bilateral, intermediate dry stage: Secondary | ICD-10-CM | POA: Diagnosis not present

## 2016-10-02 DIAGNOSIS — H43392 Other vitreous opacities, left eye: Secondary | ICD-10-CM | POA: Diagnosis not present

## 2017-02-02 ENCOUNTER — Ambulatory Visit
Admission: RE | Admit: 2017-02-02 | Discharge: 2017-02-02 | Disposition: A | Discharge status: Home / Self Care | Payer: 59 | Source: Ambulatory Visit | Attending: Family Medicine | Admitting: Family Medicine

## 2017-02-02 ENCOUNTER — Other Ambulatory Visit: Payer: Self-pay | Admitting: Family Medicine

## 2017-02-02 DIAGNOSIS — R109 Unspecified abdominal pain: Secondary | ICD-10-CM

## 2017-02-02 IMAGING — DX DG ABDOMEN 2V
3 series · 3 of 3 positions shown · non-contrast
Comparison: Abdominal series of [DATE]

CLINICAL DATA: Constipation, lower abdominal pain, nausea and
vomiting for the past several weeks. History of diverticulosis.

EXAM:
ABDOMEN - 2 VIEW

[dg abd 2 views (1 of 3)]
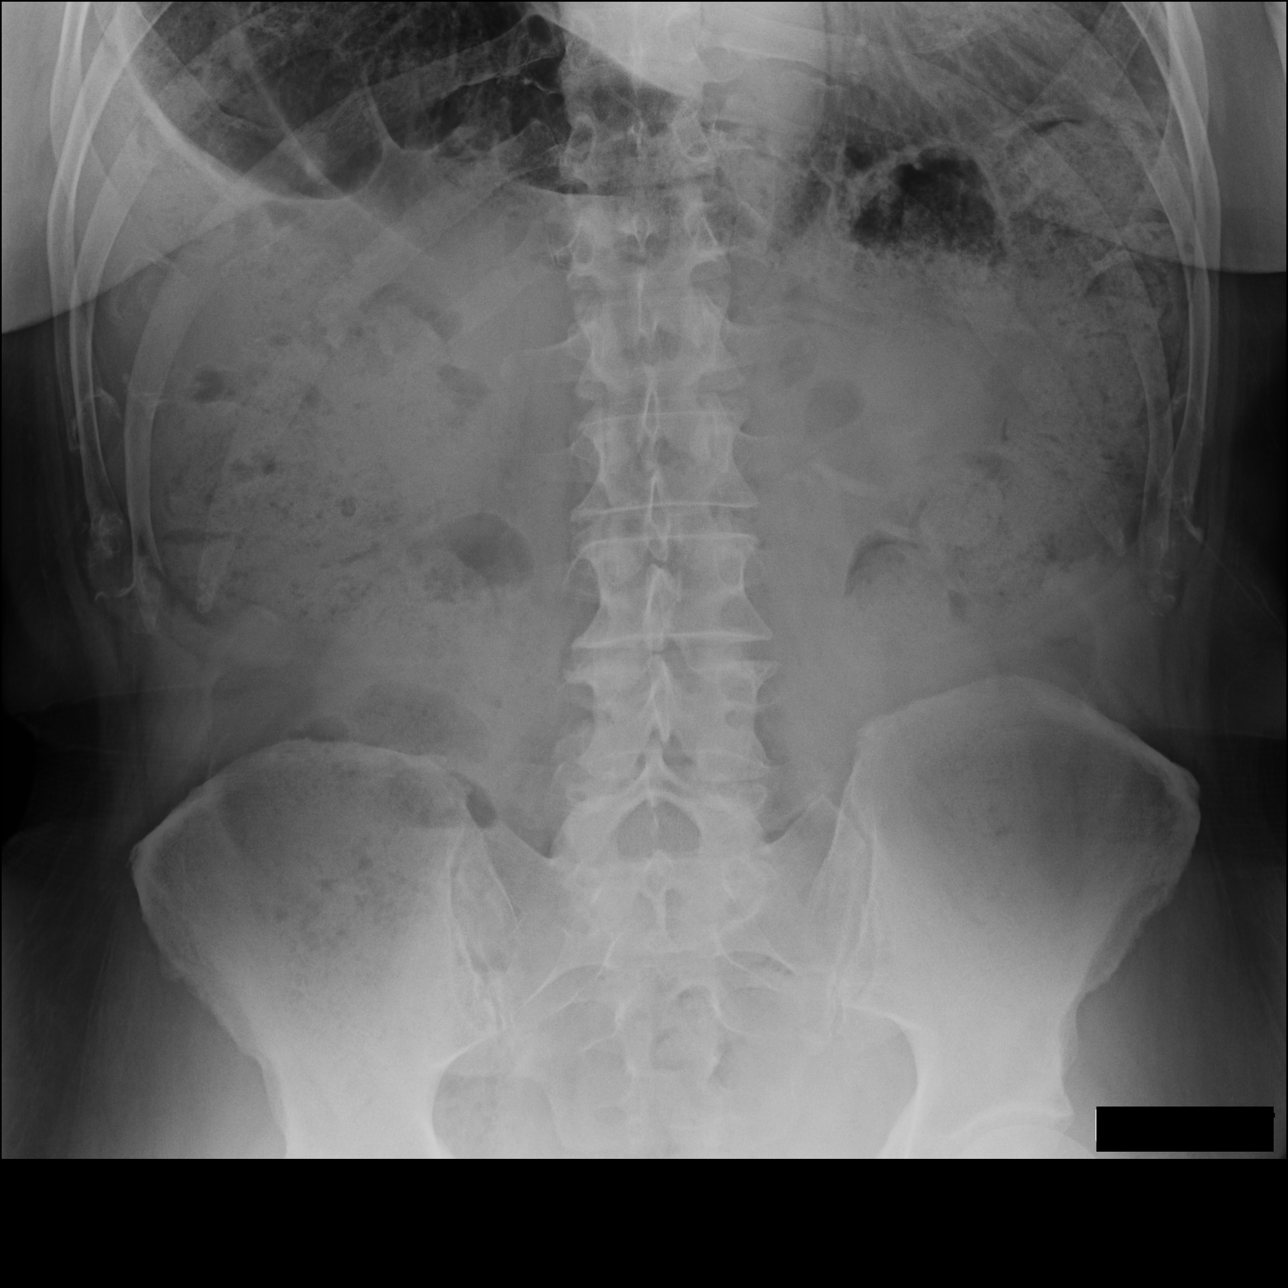

[dg abd 2 views (2 of 3)]
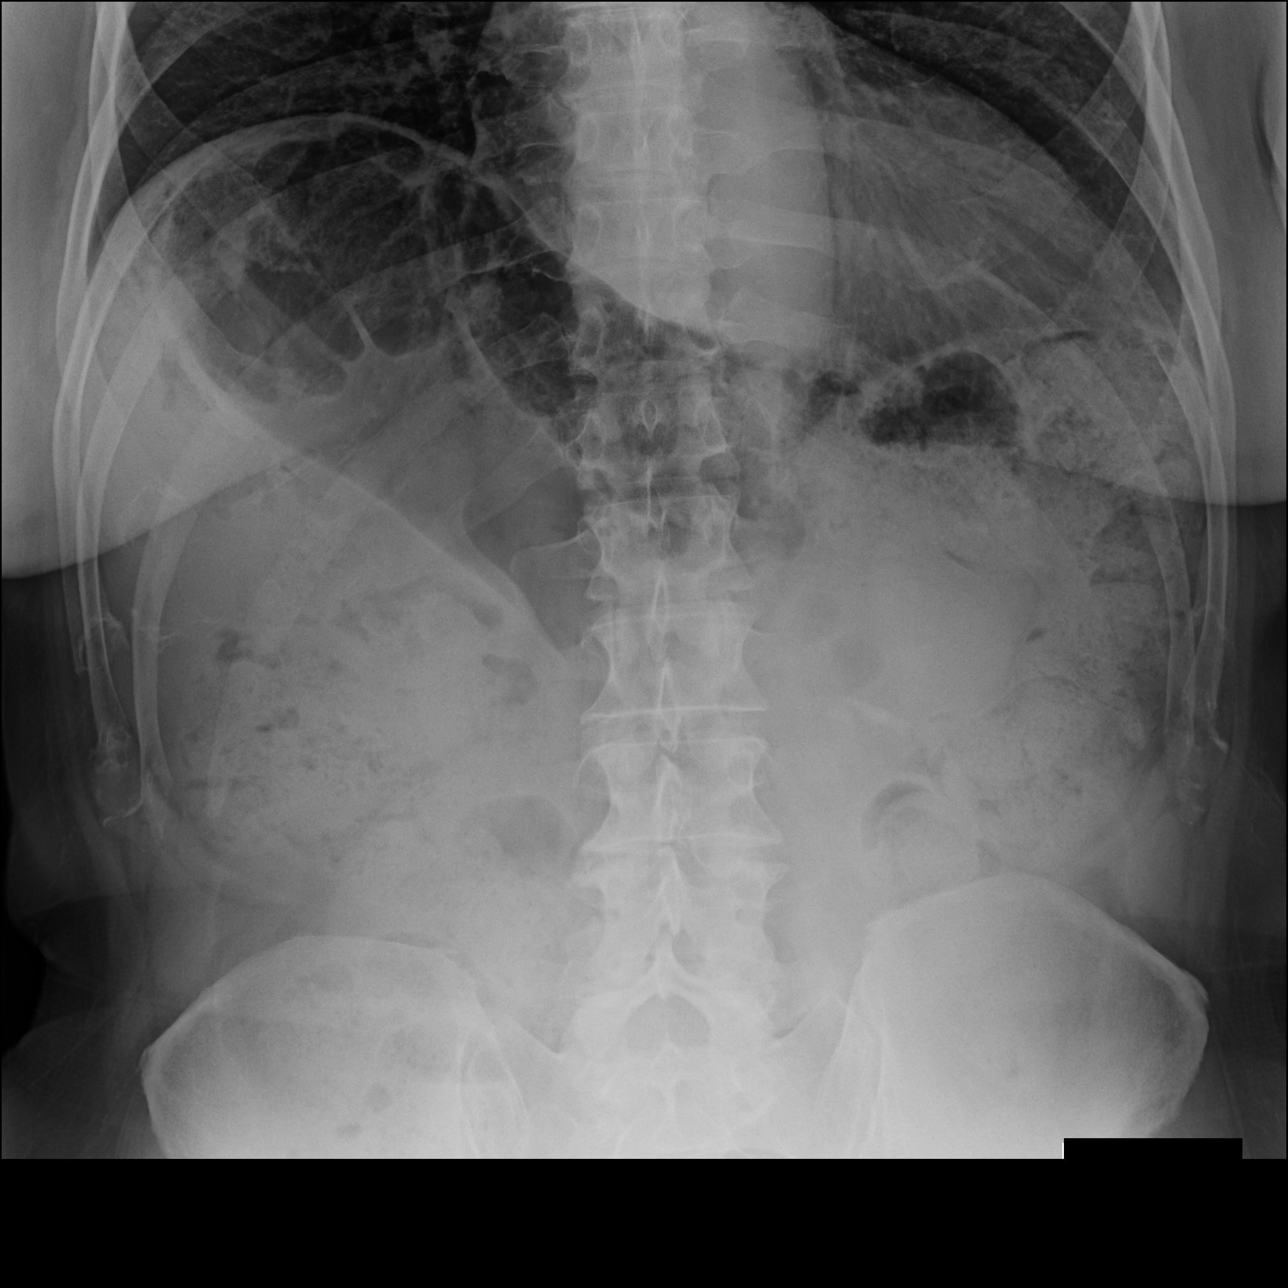

[dg abd 2 views (3 of 3)]
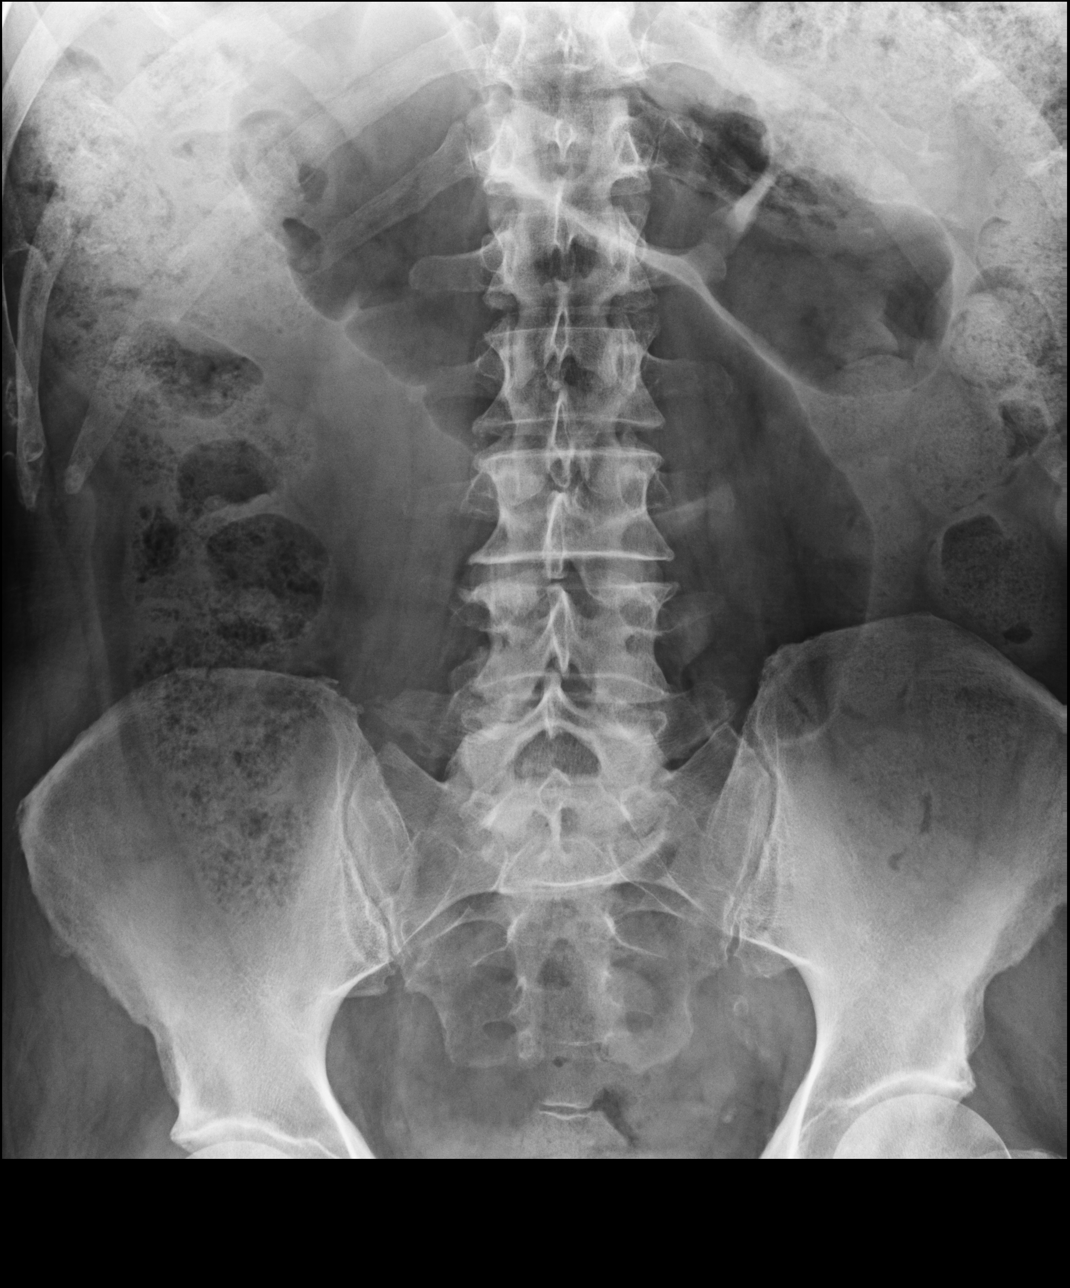

[3 of 3 positions shown; findings below may reference images not displayed]

FINDINGS: The colonic stool burden is increased. There is gaseous distention
of a loop of large bowel in the upper abdomen. There is a moderate
amount of gas in the rectum. There is no evidence of small bowel
obstruction or ileus. There is no evidence of perforation.
IMPRESSION: Increased colonic stool burden is consistent with clinical
constipation. No evidence of obstruction or perforation. Given the
history of diverticulosis, abdominal and pelvic CT scanning may be
the most useful next imaging step.

## 2017-09-27 ENCOUNTER — Other Ambulatory Visit: Payer: Self-pay | Admitting: Internal Medicine

## 2017-12-16 ENCOUNTER — Telehealth: Payer: Self-pay | Admitting: *Deleted

## 2017-12-16 NOTE — Telephone Encounter (Signed)
REFERRAL SENT TO SCHEDULING FROM DR. Briarwood, [phone].

## 2017-12-17 ENCOUNTER — Telehealth: Payer: Self-pay

## 2017-12-17 NOTE — Telephone Encounter (Signed)
Medical records received from [facility]  Referring MD: Dr. Antony Contras [phone]  Notes sent to scheduling

## 2017-12-17 NOTE — Telephone Encounter (Signed)
SENT REFERRALS TO SCHEDULING 

## 2017-12-24 DIAGNOSIS — I429 Cardiomyopathy, unspecified: Secondary | ICD-10-CM

## 2017-12-24 DIAGNOSIS — I4729 Other ventricular tachycardia: Secondary | ICD-10-CM

## 2017-12-24 DIAGNOSIS — I472 Ventricular tachycardia: Secondary | ICD-10-CM

## 2017-12-27 NOTE — H&P (Signed)
Todd Benton 01-22-2018 10:15 AM Location: [facility] Patient #: [id number] DOB: 04/29/1930 Married / Language: Todd Benton / Race: White Male   History of Present Illness Todd Maine Todd Benton; Todd 05, 2019 10:46 AM) Patient words: Discuss cath; Last office visit 12/17/17.  The patient is a 84 year old male who presents for a follow-up for Bradycardia. Todd Benton is an active 84 year old Caucasian male that was recently seen by [place] for evaluation of bradycardia episode as noted by his home blood pressure machiene. Had associated loss of energy. He was scheduled for echocardiogram, 30 day event monitor, and nuclear stress test. Stress test was canceled due to abnormal echo. He was previously followed by Dr. Sondra Come and underwent coronary angiogram in 2001 that showed minimal disease and normal LVEF. He has been told in the past to have PVC's. He denies any episodes of dizziness or syncope. No chest pain or shortness of breath.  Patient was brought in for urgent visit today after his echocardiogram was markedly abnormal with wall motion abnormalities and Ef of 25-30%. He has also been having frequent runs on NSVT. Patient has been asymptomatic, other than leg edema that has been chronic for him.  Denies any previous smoking history or alcohol use. Past medical history with hyperlipidemia and hypothyroidism. Does not exercise regularly. Blood pressure and hyperlipidemia has been stable.   Problem List/Past Medical Todd Benton; 2018/01/22 10:06 AM) Hyperlipidemia, mixed (E78.2)  Hypothyroid (E03.9)  Sudden blockage of esophagus (K22.2)  Laboratory examination (Z01.89)  06/06/2017: Hemoglobin A1c 5.7%. CBC normal. Creatinine 0.94, EGFR 76/92, potassium 4.0, CMP otherwise normal. TSH 2.56. Cholesterol 145, triglycerides 87, HDL 47, LDL 82. Bradycardia (R00.1)  EKG 12/17/2017: Sinus rhythm at 70 bpm, left atrial enlargement, one PVC, normal axis, no evidence of ischemia. Normal EKG. PVC  (premature ventricular contraction) (I49.3)  Event monitor day 2 alert 12/17/2017: 14 beat nonsustained ventricular tachycardia. Asymptomatic. NSVT (nonsustained ventricular tachycardia) (I47.2)  Event Monitor 30 days 12/17/2017: Unscheduled transmission 12/21/2017: Asymptomatic 8 beat NSVT. Echocardiogram 12/24/2017: Left ventricle cavity is normal in size. Severe decrease in global wall motion. Doppler evidence of grade I (impaired) diastolic dysfunction, normal LAP. LVEF 25-30%. Frequent PVC's seen. Mild tricuspid regurgitation. Peak RA-RV gradient 16 mmHg. RA pressure could not be estimated, as IVC is not visualized.  Allergies Todd Benton; 01/22/2018 10:06 AM) Penicillins  Hives.  Family History Todd Benton; Jan 22, 2018 10:06 AM) Mother  Deceased. at age 83 from heart attacck, stroke Father  Deceased. at age 61 from heart attack Brother 1  younger-deceasd from alzheimers, no heart attacks or strokes, no known cardiovascular conditions  Social History Todd Benton; 01-22-2018 10:06 AM) Current tobacco use  Never smoker. Non Drinker/No Alcohol Use  Marital status  Married. Living Situation  Lives with spouse. Number of Children  2. 1 deceased  Past Surgical History Todd Benton; 2018-01-22 10:06 AM) Tonsillectomy [1939]: Cataract Extraction-Bilateral [2008]: Eye [2297]: Left.  Medication History Todd Benton; 01/22/18 10:10 AM) Lansoprazole (30MG Capsule DR, 1 Oral daily) Active. Levothyroxine Sodium (88MCG Tablet, 1 Oral daily) Active. Lovastatin (20MG Tablet, 1 Oral daily) Active. Probiotic + Omega-3 (1 Oral daily) Active. Aspirin (81MG Tablet DR, 1 Oral daily) Active. PreserVision AREDS (1 Oral two times daily) Active. Potassium (1 Oral two times daily) Specific strength unknown - Active. Medications Reconciled (verbally "everything the same")  Diagnostic Studies History (Todd Benton; 01/22/2018 9:00 AM) Echocardiogram [12/24/2017]:  Echocardiogram 12/24/2017: Left ventricle cavity is normal in size. Severe decrease in global wall motion. Doppler evidence of  grade I (impaired) diastolic dysfunction, normal LAP. LVEF 25-30%. Frequent PVC's seen. Mild tricuspid regurgitation. Peak RA-RV gradient 16 mmHg. RA pressure could not be estimated, as IVC is not visualized.    Review of Systems Todd Maine, Todd Benton; 12/25/2017 11:36 AM) General Present- Tiredness (sudden episode where he felt no energy, checked his heart rate was in 30's). Not Present- Appetite Loss and Weight Gain. Respiratory Not Present- Chronic Cough and Wakes up from Sleep Wheezing or Short of Breath. Gastrointestinal Not Present- Black, Tarry Stool and Difficulty Swallowing. Musculoskeletal Not Present- Decreased Range of Motion and Muscle Atrophy. Neurological Not Present- Attention Deficit. Psychiatric Not Present- Personality Changes and Suicidal Ideation. Endocrine Not Present- Cold Intolerance and Heat Intolerance. Hematology Not Present- Abnormal Bleeding. All other systems negative  Vitals Todd Benton; 12/25/2017 10:12 AM) 12/25/2017 10:08 AM Weight: 186.19 lb Height: 71in Body Surface Area: 2.05 m Body Mass Index: 25.97 kg/m  Pulse: 74 (Regular)  P.OX: 98% (Room air) BP: 144/80 (Sitting, Left Arm, Standard)       Physical Exam Todd Maine, Todd Benton; 12/25/2017 11:36 AM) General Mental Status-Alert. General Appearance-Cooperative and Appears younger than stated age. Build & Nutrition-Moderately built.  Head and Neck Thyroid Gland Characteristics - normal size and consistency and no palpable nodules.  Chest and Lung Exam Chest and lung exam reveals -quiet, even and easy respiratory effort with no use of accessory muscles, non-tender and on auscultation, normal breath sounds, no adventitious sounds.  Cardiovascular Cardiovascular examination reveals -normal heart sounds, regular rate and rhythm with no  murmurs, carotid auscultation reveals no bruits and abdominal aorta auscultation reveals no bruits and no prominent pulsation.  Abdomen Palpation/Percussion Normal exam - Non Tender and No hepatosplenomegaly.  Peripheral Vascular Lower Extremity Inspection - Bilateral - Pigmented. Palpation - Edema - Bilateral - 2+ Pitting edema. Femoral pulse - Bilateral - Normal. Popliteal pulse - Bilateral - Normal. Dorsalis pedis pulse - Bilateral - Normal. Posterior tibial pulse - Bilateral - Normal.  Neurologic Neurologic evaluation reveals -alert and oriented x 3 with no impairment of recent or remote memory. Motor-Grossly intact without any focal deficits.  Musculoskeletal Global Assessment Left Lower Extremity - no deformities, masses or tenderness, no known fractures. Right Lower Extremity - no deformities, masses or tenderness, no known fractures.   Results Todd Maine Todd Benton; 12/25/2017 11:36 AM) Procedures  Name Value Date Echocardiography, transthoracic, real-time with image documentation (2D), includes M-mode recording, when performed, complete, with spectral Doppler echocardiography, and with color flow Doppler echocardiography (30076) Comments: Echocardiogram 12/24/2017: Left ventricle cavity is normal in size. Severe decrease in global wall motion. Doppler evidence of grade I (impaired) diastolic dysfunction, normal LAP. LVEF 25-30%. Frequent PVC's seen. Mild tricuspid regurgitation. Peak RA-RV gradient 16 mmHg. RA pressure could not be estimated, as IVC is not visualized.  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 Cathie Olden 12/20/1999: Essentially normal coronary arteries with only minor luminal irregularities. Normal LVEF. NSVT (nonsustained ventricular tachycardia) (I47.2) Story: Event Monitor 30 days 12/17/2017:  Unscheduled transmission 12/21/2017: Asymptomatic 8 beat NSVT.  Echocardiogram 12/24/2017: Left ventricle cavity is normal in size. Severe decrease in global wall motion. Doppler evidence of grade I (impaired) diastolic dysfunction, normal LAP. LVEF 25-30%. Frequent PVC's seen. Mild tricuspid regurgitation. Peak RA-RV gradient 16 mmHg. RA pressure could not be estimated, as IVC is not visualized. PVC (premature ventricular contraction) (I49.3) Story:  Event monitor day 2 alert 12/17/2017: 14 beat nonsustained ventricular tachycardia. Asymptomatic. Laboratory examination (Z96.72) Story: 06/06/2017: Hemoglobin A1c 5.7%. CBC normal. Creatinine 0.94, EGFR 76/92, potassium 4.0, CMP otherwise normal. TSH 2.56. Cholesterol 145, triglycerides 87, HDL 47, LDL 82.  Note:. Recommendation:  Patient was brought in today by [place] for urgent visit due to his systolic heart failure and frequent NSVT episodes. Patient has previously had normal coronary angiogram in 2001 and is without symptoms of chest pain or shortness of breath. I suspect that his frequent PVC's may be etiology for his low EF; however, cannot exclude coronary disease. Schedule for cardiac catheterization, and possible angioplasty. We discussed regarding risks, benefits, alternatives to this including stress testing, CTA and continued medical therapy. Patient wants to proceed. Understands <1-2% risk of death, stroke, MI, urgent CABG, bleeding, infection, renal failure but not limited to these. We will see him back after the procedure.  *I have discussed this case with Dr. Virgina Jock and he personally examined the patient and participated in formulating the plan.*  CC: Dr. Antony Contras    Signed by Todd Maine, Todd Benton (12/25/2017 11:37 AM)

## 2017-12-28 ENCOUNTER — Encounter (HOSPITAL_COMMUNITY): Payer: Self-pay | Admitting: *Deleted

## 2017-12-28 ENCOUNTER — Other Ambulatory Visit: Payer: Self-pay | Admitting: Internal Medicine

## 2017-12-28 ENCOUNTER — Encounter (HOSPITAL_COMMUNITY)
Admission: RE | Disposition: A | Discharge status: Home / Self Care | Payer: Self-pay | Source: Ambulatory Visit | Attending: Cardiology

## 2017-12-28 ENCOUNTER — Ambulatory Visit (HOSPITAL_COMMUNITY)
Admission: RE | Admit: 2017-12-28 | Discharge: 2017-12-28 | Disposition: A | Discharge status: Home / Self Care | Payer: Medicare Other | Source: Ambulatory Visit | Attending: Cardiology | Admitting: Cardiology

## 2017-12-28 DIAGNOSIS — I429 Cardiomyopathy, unspecified: Secondary | ICD-10-CM

## 2017-12-28 DIAGNOSIS — E782 Mixed hyperlipidemia: Secondary | ICD-10-CM | POA: Insufficient documentation

## 2017-12-28 DIAGNOSIS — K219 Gastro-esophageal reflux disease without esophagitis: Secondary | ICD-10-CM | POA: Insufficient documentation

## 2017-12-28 DIAGNOSIS — Z7982 Long term (current) use of aspirin: Secondary | ICD-10-CM | POA: Diagnosis not present

## 2017-12-28 DIAGNOSIS — I493 Ventricular premature depolarization: Secondary | ICD-10-CM | POA: Diagnosis not present

## 2017-12-28 DIAGNOSIS — E039 Hypothyroidism, unspecified: Secondary | ICD-10-CM | POA: Diagnosis not present

## 2017-12-28 DIAGNOSIS — I2510 Atherosclerotic heart disease of native coronary artery without angina pectoris: Secondary | ICD-10-CM | POA: Diagnosis not present

## 2017-12-28 DIAGNOSIS — Z8249 Family history of ischemic heart disease and other diseases of the circulatory system: Secondary | ICD-10-CM | POA: Diagnosis not present

## 2017-12-28 DIAGNOSIS — I472 Ventricular tachycardia: Secondary | ICD-10-CM | POA: Diagnosis not present

## 2017-12-28 DIAGNOSIS — I4729 Other ventricular tachycardia: Secondary | ICD-10-CM

## 2017-12-28 DIAGNOSIS — I5042 Chronic combined systolic (congestive) and diastolic (congestive) heart failure: Secondary | ICD-10-CM | POA: Diagnosis not present

## 2017-12-28 DIAGNOSIS — I110 Hypertensive heart disease with heart failure: Secondary | ICD-10-CM | POA: Diagnosis not present

## 2017-12-28 HISTORY — PX: AORTIC ARCH ANGIOGRAPHY: CATH118224

## 2017-12-28 HISTORY — PX: RIGHT/LEFT HEART CATH AND CORONARY ANGIOGRAPHY: CATH118266

## 2017-12-28 LAB — POCT I-STAT 3, ART BLOOD GAS (G3+)
Acid-Base Excess: 1 mmol/L (ref 0.0–2.0)
Bicarbonate: 26.9 mmol/L (ref 20.0–28.0)
O2 Saturation: 97 %
PCO2 ART: 47.4 mmHg (ref 32.0–48.0)
PH ART: 7.363 (ref 7.350–7.450)
TCO2: 28 mmol/L (ref 22–32)
pO2, Arterial: 99 mmHg (ref 83.0–108.0)

## 2017-12-28 LAB — POCT I-STAT 3, VENOUS BLOOD GAS (G3P V)
ACID-BASE EXCESS: 1 mmol/L (ref 0.0–2.0)
BICARBONATE: 27.2 mmol/L (ref 20.0–28.0)
O2 SAT: 75 %
PCO2 VEN: 51 mmHg (ref 44.0–60.0)
PO2 VEN: 43 mmHg (ref 32.0–45.0)
TCO2: 29 mmol/L (ref 22–32)
pH, Ven: 7.335 (ref 7.250–7.430)

## 2017-12-28 SURGERY — RIGHT/LEFT HEART CATH AND CORONARY ANGIOGRAPHY
Anesthesia: LOCAL

## 2017-12-28 MED ORDER — HEPARIN (PORCINE) IN NACL 2-0.9 UNIT/ML-% IJ SOLN
INTRAMUSCULAR | Status: AC
  Filled 2017-12-28: qty 500

## 2017-12-28 MED ORDER — MIDAZOLAM HCL 2 MG/2ML IJ SOLN
INTRAMUSCULAR | Status: AC
  Filled 2017-12-28: qty 2

## 2017-12-28 MED ORDER — VERAPAMIL HCL 2.5 MG/ML IV SOLN
INTRAVENOUS | Status: AC
  Filled 2017-12-28: qty 2

## 2017-12-28 MED ORDER — SODIUM CHLORIDE 0.9 % IV SOLN
INTRAVENOUS | Status: DC
  Administered 2017-12-28: 12:00:00 via INTRAVENOUS

## 2017-12-28 MED ORDER — MIDAZOLAM HCL 2 MG/2ML IJ SOLN
INTRAMUSCULAR | Status: DC | PRN
  Administered 2017-12-28: 1 mg via INTRAVENOUS

## 2017-12-28 MED ORDER — ONDANSETRON HCL 4 MG/2ML IJ SOLN: 4.0000 mg | Freq: Four times a day (QID) | INTRAMUSCULAR | Status: DC | PRN

## 2017-12-28 MED ORDER — HEPARIN (PORCINE) IN NACL 2-0.9 UNIT/ML-% IJ SOLN
INTRAMUSCULAR | Status: AC | PRN
  Administered 2017-12-28 (×2): 500 mL

## 2017-12-28 MED ORDER — LIDOCAINE HCL 1 % IJ SOLN
INTRAMUSCULAR | Status: AC
  Filled 2017-12-28: qty 20

## 2017-12-28 MED ORDER — SODIUM CHLORIDE 0.9 % IV SOLN: 250.0000 mL | INTRAVENOUS | Status: DC | PRN

## 2017-12-28 MED ORDER — SODIUM CHLORIDE 0.9 % IV SOLN: INTRAVENOUS | Status: AC

## 2017-12-28 MED ORDER — FENTANYL CITRATE (PF) 100 MCG/2ML IJ SOLN
INTRAMUSCULAR | Status: AC
  Filled 2017-12-28: qty 2

## 2017-12-28 MED ORDER — SODIUM CHLORIDE 0.9% FLUSH: 3.0000 mL | Freq: Two times a day (BID) | INTRAVENOUS | Status: DC

## 2017-12-28 MED ORDER — HEPARIN SODIUM (PORCINE) 1000 UNIT/ML IJ SOLN
INTRAMUSCULAR | Status: DC | PRN
Start: 2017-12-28 — End: 2017-12-28
  Administered 2017-12-28: 4000 [IU] via INTRAVENOUS

## 2017-12-28 MED ORDER — FENTANYL CITRATE (PF) 100 MCG/2ML IJ SOLN
INTRAMUSCULAR | Status: DC | PRN
  Administered 2017-12-28: 25 ug via INTRAVENOUS

## 2017-12-28 MED ORDER — LIDOCAINE HCL (PF) 1 % IJ SOLN
INTRAMUSCULAR | Status: DC | PRN
  Administered 2017-12-28: 2 mL
  Administered 2017-12-28: 1 mL

## 2017-12-28 MED ORDER — SODIUM CHLORIDE 0.9% FLUSH: 3.0000 mL | INTRAVENOUS | Status: DC | PRN

## 2017-12-28 MED ORDER — HEPARIN (PORCINE) IN NACL 2-0.9 UNIT/ML-% IJ SOLN
INTRAMUSCULAR | Status: DC | PRN
  Administered 2017-12-28 (×2): 5 mL via INTRA_ARTERIAL

## 2017-12-28 MED ORDER — ACETAMINOPHEN 325 MG PO TABS: 650.0000 mg | ORAL_TABLET | ORAL | Status: DC | PRN

## 2017-12-28 MED ORDER — HEPARIN SODIUM (PORCINE) 1000 UNIT/ML IJ SOLN
INTRAMUSCULAR | Status: AC
  Filled 2017-12-28: qty 1

## 2017-12-28 MED ORDER — ASPIRIN 81 MG PO CHEW
CHEWABLE_TABLET | ORAL | Status: AC
  Administered 2017-12-28: 81 mg via ORAL
  Filled 2017-12-28: qty 1

## 2017-12-28 MED ORDER — IOPAMIDOL (ISOVUE-370) INJECTION 76%
INTRAVENOUS | Status: DC | PRN
  Administered 2017-12-28: 100 mL via INTRA_ARTERIAL

## 2017-12-28 MED ORDER — ASPIRIN 81 MG PO CHEW
81.0000 mg | CHEWABLE_TABLET | ORAL | Status: AC
  Administered 2017-12-28: 81 mg via ORAL

## 2017-12-28 MED ORDER — IOPAMIDOL (ISOVUE-370) INJECTION 76%
INTRAVENOUS | Status: AC
  Filled 2017-12-28: qty 100

## 2017-12-28 MED ORDER — IOPAMIDOL (ISOVUE-370) INJECTION 76%
INTRAVENOUS | Status: AC
  Filled 2017-12-28: qty 50

## 2017-12-28 SURGICAL SUPPLY — 19 items
BAND CMPR LRG ZPHR (HEMOSTASIS) ×1
BAND ZEPHYR COMPRESS 30 LONG (HEMOSTASIS) ×1 IMPLANT
CATH BALLN WEDGE 5F 110CM (CATHETERS) ×1 IMPLANT
CATH INFINITI 5 FR 3DRC (CATHETERS) ×1 IMPLANT
CATH INFINITI 5 FR JL3.5 (CATHETERS) ×1 IMPLANT
CATH INFINITI 5FR ANG PIGTAIL (CATHETERS) ×1 IMPLANT
CATH INFINITI JR4 5F (CATHETERS) ×1 IMPLANT
CATH LAUNCHER 5F NOTO (CATHETERS) IMPLANT
CATHETER LAUNCHER 5F NOTO (CATHETERS) ×2
GLIDESHEATH SLEND A-KIT 6F 22G (SHEATH) ×1 IMPLANT
GUIDEWIRE ANGLED .035X150CM (WIRE) ×1 IMPLANT
GUIDEWIRE INQWIRE 1.5J.035X260 (WIRE) IMPLANT
INQWIRE 1.5J .035X260CM (WIRE) ×2
KIT HEART LEFT (KITS) ×2 IMPLANT
PACK CARDIAC CATHETERIZATION (CUSTOM PROCEDURE TRAY) ×2 IMPLANT
SHEATH GLIDE SLENDER 4/5FR (SHEATH) ×1 IMPLANT
SYR MEDRAD MARK V 150ML (SYRINGE) ×1 IMPLANT
TRANSDUCER W/STOPCOCK (MISCELLANEOUS) ×2 IMPLANT
TUBING CIL FLEX 10 FLL-RA (TUBING) ×2 IMPLANT

## 2017-12-28 NOTE — Discharge Instructions (Signed)

## 2017-12-28 NOTE — Interval H&P Note (Signed)
History and Physical Interval Note:  12/28/2017 12:52 PM  Todd Benton  has presented today for surgery, with the diagnosis of nsvt, cardiomyopathy  The various methods of treatment have been discussed with the patient and family. After consideration of risks, benefits and other options for treatment, the patient has consented to  Procedure(s): RIGHT/LEFT HEART CATH AND CORONARY ANGIOGRAPHY (N/A) as a surgical intervention .  The patient's history has been reviewed, patient examined, no change in status, stable for surgery.  I have reviewed the patient's chart and labs.  Questions were answered to the patient's satisfaction.    2012 Appropriate Use Criteria for Diagnostic Catheterization Home / Select Test of Interest Indication for RHC Cardiomyopathies Cardiomyopathies (Right and Left Heart Catheterization OR Right Heart Catheterization Alone With/Wit  Cardiomyopathies  (Right and Left Heart Catheterization OR  Right Heart Catheterization Alone With/Without Left Ventriculography and Coronary Angiography)  Link Here: [email or web address] Indication:  1. Known or suspected cardiomyopathy with or without heart failure A (7) Indication: 93; Score 7     Tyrel Lex J Tywana Robotham

## 2017-12-29 ENCOUNTER — Encounter (HOSPITAL_COMMUNITY): Payer: Self-pay | Admitting: Cardiology

## 2017-12-29 MED FILL — Lidocaine HCl Local Inj 1%: INTRAMUSCULAR | Qty: 20 | Status: AC

## 2017-12-29 MED FILL — Heparin Sodium (Porcine) 2 Unit/ML in Sodium Chloride 0.9%: INTRAMUSCULAR | Qty: 1000 | Status: AC

## 2018-01-07 ENCOUNTER — Encounter (HOSPITAL_COMMUNITY): Payer: Self-pay | Admitting: Cardiology

## 2018-01-16 ENCOUNTER — Other Ambulatory Visit: Payer: Self-pay | Admitting: Internal Medicine

## 2018-01-19 ENCOUNTER — Other Ambulatory Visit: Payer: Self-pay | Admitting: Internal Medicine

## 2018-02-19 ENCOUNTER — Ambulatory Visit: Payer: Medicare Other | Admitting: Nurse Practitioner

## 2018-03-02 ENCOUNTER — Ambulatory Visit: Payer: Medicare Other | Admitting: Nurse Practitioner

## 2018-03-02 ENCOUNTER — Encounter: Payer: Self-pay | Admitting: Nurse Practitioner

## 2018-03-02 ENCOUNTER — Encounter (INDEPENDENT_AMBULATORY_CARE_PROVIDER_SITE_OTHER): Payer: Self-pay

## 2018-03-02 VITALS — BP 122/64 | HR 78 | Ht 71.0 in | Wt 180.0 lb

## 2018-03-02 DIAGNOSIS — K5900 Constipation, unspecified: Secondary | ICD-10-CM | POA: Diagnosis not present

## 2018-03-02 DIAGNOSIS — K219 Gastro-esophageal reflux disease without esophagitis: Secondary | ICD-10-CM | POA: Diagnosis not present

## 2018-03-02 DIAGNOSIS — Z860100 Personal history of colon polyps, unspecified: Secondary | ICD-10-CM

## 2018-03-02 MED ORDER — LANSOPRAZOLE 30 MG PO CPDR: 30.0000 mg | DELAYED_RELEASE_CAPSULE | Freq: Every day | ORAL | 3 refills | Status: DC

## 2018-03-02 NOTE — Progress Notes (Signed)
      IMPRESSION and PLAN:    #1. Chronic constipation. Takes a dulcolax tabs as needed every few days. Ideally he should be on something to maintain normalcy.  -trial of daily benefiber.  -increase water intake to at least 6 glasses a day.  --if constipation not resolving in 2-3 weeks or develops pain, bleeding, etc. Then call us back   #2. Chronic GERD / hx of esophageal strictures.  Significant heartburn since running out of prevacid 6 weeks ago.  No dysphagia.  -anti-reflux measures discussed.  -He already limits caffeine intake -He sometimes has chocolate before bed, we discussed effects of chocolate on LES. For the most part he already goes to bed on empty stomach.  -GERD literature given -Refill prevacid #30 with 12 refills   #3.  Hx of adenomatous colon polyps.No polyps / cancers on last colonoscopy in 2009.  -Likely aged out of surveillance but will defer to Dr. Henrene Pastor. Patient isn't having any rectal bleeding.     HPI:    Chief Complaint: GERD and constipation.   Patient is a 84 yo male known to Dr. Henrene Pastor for GERD with peptic stricture requiring dilation, and history of colon polyp.  He ran out of prevacid 6 weeks ago and has been miserable since. Having heartburn and gas again.  The heartburn is random, no significant regurgitation. No dysphagia. He tried Gas-x.  Mr. Bunn also complains of chronic constipation. He feels constipation has progressed over the last few years. Every few days he takes a Dulcolax with subsequent passage of a large amount of stool. Marland Kitchen He drinks only 3-4 glasses of fluid a day. Gets some fiber but overall not enough.    Review of systems:    No chest pain, no sob. No urinary sx.    Past Medical History:  Diagnosis Date  . Coronary artery disease    nonobstuctive  . Diverticulosis   . External hemorrhoid   . GERD (gastroesophageal reflux disease)   . Hiatal hernia   . Hx of adenomatous colonic polyps   . Hyperlipidemia   .  Hypertension   . Macular degeneration   . Peptic stricture of esophagus   . Premature ventricular contractions    frequent  . Thyroid disease   . Ventricular tachycardia (HCC)    nonsustained    Patient's surgical history, family medical history, social history, medications and allergies were all reviewed in Epic    Physical Exam:     BP 122/64   Pulse 78   Ht 5\' 11"  (1.803 m)   Wt 180 lb (81.6 kg)   BMI 25.10 kg/m   GENERAL:  Well developed white male in NAD PSYCH: :Pleasant, cooperative, normal affect EENT:  conjunctiva pink, mucous membranes moist, neck supple without masses CARDIAC:  RR, occas irreg beat, + murmur heard, no peripheral edema PULM: Normal respiratory effort, lungs CTA bilaterally, no wheezing ABDOMEN:  Nondistended, soft, nontender. No obvious masses, no hepatomegaly,  normal bowel sounds SKIN:  turgor, no lesions seen Musculoskeletal:  Normal muscle tone, normal strength NEURO: Alert and oriented x 3, no focal neurologic deficits   Tye Savoy , NP 03/02/2018, 9:57 AM

## 2018-03-02 NOTE — Patient Instructions (Addendum)
If you are age 82 or older, your body mass index should be between 23-30. Your Body mass index is 25.1 kg/m. If this is out of the aforementioned range listed, please consider follow up with your Primary Care Provider.  If you are age 58 or younger, your body mass index should be between 19-25. Your Body mass index is 25.1 kg/m. If this is out of the aformentioned range listed, please consider follow up with your Primary Care Provider.   We have sent the following medications to your pharmacy for you to pick up at your convenience: Prevacid  You have been given GERD literature.  Start daily Benefiber.  Increase water intake to 6 glasses daily at least.  Follow up in one year.  You will be placed on recall for one year.  Thank you for choosing me and [facility].   Tye Savoy, NP

## 2018-03-02 NOTE — Progress Notes (Signed)
Agree with assessment and plans 

## 2018-03-29 ENCOUNTER — Telehealth: Payer: Self-pay | Admitting: Nurse Practitioner

## 2018-03-29 NOTE — Telephone Encounter (Signed)
Patient feels he is able to maintain the regimen. He will contact us if his symptoms return despite these measures.

## 2018-03-29 NOTE — Telephone Encounter (Signed)
Patient calling to give an update on symptoms. Patient states constipation was slowly resolved by benefiber and acid reflux has cleared after he started back on prescribed medication. Pt wanting to know if there is any next steps and if he continues to take benefiber.

## 2018-12-20 ENCOUNTER — Other Ambulatory Visit (HOSPITAL_COMMUNITY): Payer: Self-pay | Admitting: Cardiology

## 2018-12-21 LAB — BASIC METABOLIC PANEL
BUN / CREAT RATIO: 15 (ref 10–24)
BUN: 16 mg/dL (ref 8–27)
CHLORIDE: 104 mmol/L (ref 96–106)
CO2: 25 mmol/L (ref 20–29)
CREATININE: 1.08 mg/dL (ref 0.76–1.27)
Calcium: 8.9 mg/dL (ref 8.6–10.2)
GFR calc Af Amer: 71 mL/min/{1.73_m2} (ref >59)
GFR calc non Af Amer: 62 mL/min/{1.73_m2} (ref >59)
GLUCOSE: 96 mg/dL (ref 65–99)
Potassium: 4.8 mmol/L (ref 3.5–5.2)
Sodium: 147 mmol/L — ABNORMAL HIGH (ref 134–144)

## 2018-12-28 ENCOUNTER — Other Ambulatory Visit: Payer: Self-pay

## 2018-12-28 MED ORDER — ENTRESTO 24-26 MG PO TABS: 1.0000 | ORAL_TABLET | Freq: Two times a day (BID) | ORAL | 2 refills | Status: DC

## 2018-12-30 NOTE — Progress Notes (Signed)
Patient is here for follow up visit.  Subjective:   _0  ID: Todd Benton, male    DOB: 11-23-1929, 85 y.o.   MRN: [id number]  Chief Complaint  Patient presents with  . Cardiomyopathy  . Follow-up    86mo . Results    HPI   85year old Caucasian male with hypertension, hyperlipidemia, GERD, h/o NSVT, heart failure with reduced ejection fraction, here for 3 month follow up.  At last visit, I had continued his metoprolol succinate 25 mg daily, Entresto 24-26 mg bid, spironolactone 25 mg daily.  He is doing well, works out at [employer] week 4 miles in 20 min without any symptoms. His leg edema has completely resolved. While his BP has stayed low, around 90-120/-65 mmHg, he denies any presyncopal or syncopal symptoms.   On a separate note, he reports persistent sinus drainage and dry cough. This has predated his diagnsis of cardiomyopathy, and initiation of treatment.   Past Medical History:  Diagnosis Date  . Coronary artery disease    nonobstuctive  . Diverticulosis   . External hemorrhoid   . GERD (gastroesophageal reflux disease)   . Hiatal hernia   . Hx of adenomatous colonic polyps   . Hyperlipidemia   . Hypertension   . Macular degeneration   . Peptic stricture of esophagus   . Premature ventricular contractions    frequent  . Thyroid disease   . Ventricular tachycardia (HCC)    nonsustained     Past Surgical History:  Procedure Laterality Date  . AORTIC ARCH ANGIOGRAPHY N/A 12/28/2017   Procedure: AORTIC ARCH ANGIOGRAPHY;  Surgeon: PNigel Mormon MD;  [facility];  Service: Cardiovascular;  Laterality: N/A;  . CARDIAC CATHETERIZATION  12/15/2008   EF50%/nonobstuctive coronary arteries/lt ventricular systolic function is at the lower limits of normal/segmental wall motion abnormalities probably due to catheter-induced ectopy/no severe CAD to explain chest pain  . CARDIAC CATHETERIZATION  12/20/1999   EF65/70%/normal coronary arteries with only minor luminal irregularities consistent with someone 83yrs old/normal lt ventricular systolic function  . CATARACT EXTRACTION Bilateral   . EYE SURGERY Right   . RIGHT/LEFT HEART CATH AND CORONARY ANGIOGRAPHY N/A 12/28/2017   Procedure: RIGHT/LEFT HEART CATH AND CORONARY ANGIOGRAPHY;  Surgeon: PNigel Mormon MD;  Location[facility];  Service: Cardiovascular;  Laterality: N/A;  . TONSILLECTOMY     at age 85    Social History   Socioeconomic History  . Marital status: Married    Spouse name: Not on file  . Number of children: 2  . Years of education: Not on file  . Highest education level: Not on file  Occupational History  . Occupation: Retired    EFish farm manager RETIRED    Comment: Worked in sTax inspectorat [employer] . Financial resource strain: Not on file  . Food insecurity:    Worry: Not on file    Inability: Not on file  . Transportation needs:    Medical: Not on file    Non-medical: Not on file  Tobacco Use  . Smoking status: Never Smoker  . Smokeless tobacco: Never Used  Substance and Sexual Activity  . Alcohol use: No    Alcohol/week: 0.0 standard drinks  . Drug use: No  . Sexual activity: Not on file  Lifestyle  . Physical activity:    Days per week: Not on file    Minutes per session: Not on file  .  Stress: Not on file  Relationships  . Social connections:    Talks on phone: Not on file    Gets together: Not on file    Attends religious service: Not on file    Active member of club or organization: Not on file    Attends meetings of clubs or organizations: Not on file    Relationship status: Not on file  . Intimate partner violence:    Fear of current or ex partner: Not on file    Emotionally abused: Not on file    Physically abused: Not on file    Forced sexual activity: Not on file  Other Topics Concern  . Not on file  Social History Narrative  . Not on file     Current  Outpatient Medications on File Prior to Visit  Medication Sig Dispense Refill  . aspirin 81 MG tablet Take 81 mg by mouth daily.      Marland Kitchen docusate sodium (COLACE) 100 MG capsule Take 100 mg by mouth continuous as needed for mild constipation.    Marland Kitchen ENTRESTO 24-26 MG Take 1 tablet by mouth 2 (two) times daily. 90 tablet 2  . lansoprazole (PREVACID) 30 MG capsule Take 1 capsule (30 mg total) by mouth daily. 90 capsule 3  . levothyroxine (SYNTHROID, LEVOTHROID) 88 MCG tablet Take 88 mcg by mouth at bedtime.     . lovastatin (MEVACOR) 20 MG tablet Take 20 mg by mouth at bedtime.      . metoprolol succinate (TOPROL-XL) 25 MG 24 hr tablet Take 25 mg by mouth daily.  2  . Multiple Vitamins-Minerals (OCUVITE PRESERVISION PO) Take 1 capsule by mouth 2 (two) times daily.     . Probiotic Product (PROBIOTIC DAILY PO) Take 1 capsule by mouth daily.    . Wheat Dextrin (BENEFIBER) POWD Take by mouth daily.     No current facility-administered medications on file prior to visit.     Cardiovascular studies:  EKG 12/31/2018: Probably sinus rhythm 58 bpm.  Borderline first degree AV block. Baseline artifact seen.   Echocardiogram 09/22/2018:  1. Left ventricle cavity is normal in size. Mild concentric hypertrophy of the left ventricle. Moderate decrease in global wall motion. Doppler evidence of grade I (impaired) diastolic dysfunction, normal LAP. Calculated EF 38%. 2. Trace mitral regurgitation. 3. Mild tricuspid regurgitation with peak gradient 19 mm of Hg. Can't calculate PA pressure as IVC is not visualized. 4. No diagnostic change c.f. echo. of 04/14/2018.  ABI 05/19/2018: This exam reveals normal perfusion of the right lower extremity (ABI 0.98) and mildly decreased perfusion of the left lower extremity, noted at the dorsalis pedis artery level (ABI 0.83). Normal triphasic waveforms of the right ankle. Moderately abnormal waveforms of the left ankle.  Holter Monitor 48 hours 01/18/2018: NSR. Occasional  PVCs and rare V-Couplets and triplets. No NSVT. Occasional  PACs. No symmptoms reported.  Cath 12/29/2017: Nonobstructive coronary artery disease LM: Normal LAD: Normal. 2nd Diag 50% proximal disease LCx: Normal RCA: Inadequate visualization. Unusually high takeoff.  Event monitor day 2 alert 12/17/2017: 14 beat nonsustained ventricular tachycardia.  Asymptomatic.   Recent labs:   CMP Latest Ref Rng & Units 12/20/2018  Glucose 65 - 99 mg/dL 96  BUN 8 - 27 mg/dL 16  Creatinine 0.76 - 1.27 mg/dL 1.08  Sodium 134 - 144 mmol/L 147(H)  Potassium 3.5 - 5.2 mmol/L 4.8  Chloride 96 - 106 mmol/L 104  CO2 20 - 29 mmol/L 25  Calcium 8.6 - 10.2 mg/dL  8.9  Total Protein 6.0 - 8.3 g/dL -  Total Bilirubin 0.3 - 1.2 mg/dL -  Alkaline Phos 39 - 117 U/L -  AST 0 - 37 U/L -  ALT 0 - 53 U/L -  eGFR non Af Amer >39m/min 62  eGFR Af Amer >674mmin 71   CBC Latest Ref Rng & Units 12/16/2008  WBC 4.0 - 10.5 K/uL 6.8  Hemoglobin 13.0 - 17.0 g/dL 14.2  Hematocrit 39.0 - 52.0 % 41.0  Platelets 150 - 400 K/uL 162   06/06/2017: Hemoglobin A1c 5.7%.  TSH 2.56. Cholesterol 145, triglycerides 87, HDL 47, LDL 82.  Review of Systems  Constitution: Negative for decreased appetite, malaise/fatigue, weight gain and weight loss.  HENT: Negative for congestion.   Eyes: Negative for visual disturbance.  Cardiovascular: Negative for chest pain, dyspnea on exertion, leg swelling, palpitations and syncope.  Respiratory: Negative for shortness of breath.   Endocrine: Negative for cold intolerance.  Hematologic/Lymphatic: Does not bruise/bleed easily.  Skin: Negative for itching and rash.  Musculoskeletal: Negative for myalgias.  Gastrointestinal: Negative for abdominal pain, nausea and vomiting.  Genitourinary: Negative for dysuria.  Neurological: Negative for dizziness and weakness.  Psychiatric/Behavioral: The patient is not nervous/anxious.   All other systems reviewed and are negative.      Objective:     Vitals:   12/31/18 1002  BP: 106/67  Pulse: 67  SpO2: 94%     Physical Exam  Constitutional: He is oriented to person, place, and time. He appears well-developed and well-nourished. No distress.  appears younger than stated age  HENT:  Head: Normocephalic and atraumatic.  Eyes: Pupils are equal, round, and reactive to light. Conjunctivae are normal.  Neck: No JVD present.  Cardiovascular: Normal rate, regular rhythm and intact distal pulses.  Pulmonary/Chest: Effort normal and breath sounds normal. He has no wheezes. He has no rales.  Abdominal: Soft. Bowel sounds are normal. There is no rebound.  Musculoskeletal:        General: No edema.  Lymphadenopathy:    He has no cervical adenopathy.  Neurological: He is alert and oriented to person, place, and time. No cranial nerve deficit.  Skin: Skin is warm and dry.  Psychiatric: He has a normal mood and affect.  Nursing note and vitals reviewed.      Assessment & Recommendations:   [age]ear old Caucasian male with hypertension, hyperlipidemia, GERD, h/o NSVT, heart failure with reduced ejection fraction, here for 3 month follow up.  HFrEF: Nonischemic. Stable leg edema. Continue metoprolol succinate 25 mg daily, Entresto 24-26 mg bid. Reduce spironolactone to 12.5 mg daily to avoid hypotension. I suspect his hypernatremia may also improve after reducing aggressive diuresis. Given his advanced age, occasional low blood pressure, I do not think he will tolerate higher dosages of above heart failure therapy. I have requested him to [place] results from upcoming physical with PCP in May 2020.  Claudication: Mildly reduced Lt ABI. Continue medical tharapy.  I will see him back in 6 months.  MaNigel MormonMD [facility] Pager: [phone]: [phone] no answer Cell [phone]

## 2018-12-31 ENCOUNTER — Ambulatory Visit: Payer: Medicare Other | Admitting: Cardiology

## 2018-12-31 ENCOUNTER — Encounter: Payer: Self-pay | Admitting: Cardiology

## 2018-12-31 ENCOUNTER — Other Ambulatory Visit: Payer: Self-pay

## 2018-12-31 VITALS — BP 106/67 | HR 67 | Ht 72.0 in | Wt 178.2 lb

## 2018-12-31 DIAGNOSIS — E870 Hyperosmolality and hypernatremia: Secondary | ICD-10-CM | POA: Diagnosis not present

## 2018-12-31 DIAGNOSIS — I5022 Chronic systolic (congestive) heart failure: Secondary | ICD-10-CM

## 2018-12-31 DIAGNOSIS — I429 Cardiomyopathy, unspecified: Secondary | ICD-10-CM

## 2018-12-31 DIAGNOSIS — I5032 Chronic diastolic (congestive) heart failure: Secondary | ICD-10-CM | POA: Insufficient documentation

## 2018-12-31 DIAGNOSIS — I5020 Unspecified systolic (congestive) heart failure: Secondary | ICD-10-CM | POA: Insufficient documentation

## 2018-12-31 MED ORDER — SPIRONOLACTONE 25 MG PO TABS: 12.5000 mg | ORAL_TABLET | Freq: Every day | ORAL | 3 refills | Status: DC

## 2019-01-07 HISTORY — PX: MOHS SURGERY: SUR867

## 2019-02-01 ENCOUNTER — Encounter: Payer: Self-pay | Admitting: Cardiology

## 2019-02-03 ENCOUNTER — Telehealth: Payer: Self-pay

## 2019-02-03 MED ORDER — ENTRESTO 24-26 MG PO TABS: 1.0000 | ORAL_TABLET | Freq: Two times a day (BID) | ORAL | 2 refills | Status: DC

## 2019-02-03 NOTE — Telephone Encounter (Signed)
Done

## 2019-02-03 NOTE — Telephone Encounter (Signed)
Pt called and asked for a refill for his entresto, is this ok to send?

## 2019-02-07 ENCOUNTER — Other Ambulatory Visit: Payer: Self-pay

## 2019-02-07 DIAGNOSIS — I5022 Chronic systolic (congestive) heart failure: Secondary | ICD-10-CM

## 2019-02-07 NOTE — Telephone Encounter (Signed)
Pt called and says you told him to cut them in half and he has enough to last to about July; But [facility] [address] [facility]

## 2019-02-08 MED ORDER — SPIRONOLACTONE 25 MG PO TABS: 12.5000 mg | ORAL_TABLET | Freq: Every day | ORAL | 3 refills | Status: DC

## 2019-03-02 ENCOUNTER — Encounter: Payer: Self-pay | Admitting: General Surgery

## 2019-03-03 ENCOUNTER — Ambulatory Visit (INDEPENDENT_AMBULATORY_CARE_PROVIDER_SITE_OTHER): Payer: Medicare Other | Admitting: Internal Medicine

## 2019-03-03 ENCOUNTER — Other Ambulatory Visit: Payer: Self-pay

## 2019-03-03 ENCOUNTER — Encounter: Payer: Self-pay | Admitting: Internal Medicine

## 2019-03-03 VITALS — Ht 72.0 in | Wt 175.0 lb

## 2019-03-03 DIAGNOSIS — K5900 Constipation, unspecified: Secondary | ICD-10-CM

## 2019-03-03 DIAGNOSIS — K219 Gastro-esophageal reflux disease without esophagitis: Secondary | ICD-10-CM

## 2019-03-03 NOTE — Patient Instructions (Addendum)
If you are age 83 or older, your body mass index should be between 23-30. Your Body mass index is 23.73 kg/m. If this is out of the aforementioned range listed, please consider follow up with your Primary Care Provider.  If you are age 83 or older, your body mass index should be between 19-25. Your Body mass index is 23.73 kg/m. If this is out of the aformentioned range listed, please consider follow up with your Primary Care Provider.   We have sent the following medications to your pharmacy for you to pick up at your convenience:  PREVACID (lansoprazole) 30 mg daily; #30; 11 refills  Reflux precautions (see attached)  Continue Dulcolax as needed for constipation.  Increase fiber and water as directed.  Routine GI follow-up 1 year.  Sooner if needed.  Thank you for choosing me and [facility].  Todd Shorts, MD   Gastroesophageal Reflux Disease, Adult Gastroesophageal reflux (GER) happens when acid from the stomach flows up into the tube that connects the mouth and the stomach (esophagus). Normally, food travels down the esophagus and stays in the stomach to be digested. However, when a person has GER, food and stomach acid sometimes move back up into the esophagus. If this becomes a more serious problem, the person may be diagnosed with a disease called gastroesophageal reflux disease (GERD). GERD occurs when the reflux:  Happens often.  Causes frequent or severe symptoms.  Causes problems such as damage to the esophagus. When stomach acid comes in contact with the esophagus, the acid may cause soreness (inflammation) in the esophagus. Over time, GERD may create small holes (ulcers) in the lining of the esophagus. What are the causes? This condition is caused by a problem with the muscle between the esophagus and the stomach (lower esophageal sphincter, or LES). Normally, the LES muscle closes after food passes through the esophagus to the stomach. When the LES is  weakened or abnormal, it does not close properly, and that allows food and stomach acid to go back up into the esophagus. The LES can be weakened by certain dietary substances, medicines, and medical conditions, including:  Tobacco use.  Pregnancy.  Having a hiatal hernia.  Alcohol use.  Certain foods and beverages, such as coffee, chocolate, onions, and peppermint. What increases the risk? You are more likely to develop this condition if you:  Have an increased body weight.  Have a connective tissue disorder.  Use NSAID medicines. What are the signs or symptoms? Symptoms of this condition include:  Heartburn.  Difficult or painful swallowing.  The feeling of having a lump in the throat.  Abitter taste in the mouth.  Bad breath.  Having a large amount of saliva.  Having an upset or bloated stomach.  Belching.  Chest pain. Different conditions can cause chest pain. Make sure you see your health care provider if you experience chest pain.  Shortness of breath or wheezing.  Ongoing (chronic) cough or a night-time cough.  Wearing away of tooth enamel.  Weight loss. How is this diagnosed? Your health care provider will take a medical history and perform a physical exam. To determine if you have mild or severe GERD, your health care provider may also monitor how you respond to treatment. You may also have tests, including:  A test to examine your stomach and esophagus with a small camera (endoscopy).  A test thatmeasures the acidity level in your esophagus.  A test thatmeasures how much pressure is on your esophagus.  A  barium swallow or modified barium swallow test to show the shape, size, and functioning of your esophagus. How is this treated? The goal of treatment is to help relieve your symptoms and to prevent complications. Treatment for this condition may vary depending on how severe your symptoms are. Your health care provider may recommend:  Changes  to your diet.  Medicine.  Surgery. Follow these instructions at home: Eating and drinking   Follow a diet as recommended by your health care provider. This may involve avoiding foods and drinks such as: ? Coffee and tea (with or without caffeine). ? Drinks that containalcohol. ? Energy drinks and sports drinks. ? Carbonated drinks or sodas. ? Chocolate and cocoa. ? Peppermint and mint flavorings. ? Garlic and onions. ? Horseradish. ? Spicy and acidic foods, including peppers, chili powder, curry powder, vinegar, hot sauces, and barbecue sauce. ? Citrus fruit juices and citrus fruits, such as oranges, lemons, and limes. ? Tomato-based foods, such as red sauce, chili, salsa, and pizza with red sauce. ? Fried and fatty foods, such as donuts, french fries, potato chips, and high-fat dressings. ? High-fat meats, such as hot dogs and fatty cuts of red and white meats, such as rib eye steak, sausage, ham, and bacon. ? High-fat dairy items, such as whole milk, butter, and cream cheese.  Eat small, frequent meals instead of large meals.  Avoid drinking large amounts of liquid with your meals.  Avoid eating meals during the 2-3 hours before bedtime.  Avoid lying down right after you eat.  Do not exercise right after you eat. Lifestyle   Do not use any products that contain nicotine or tobacco, such as cigarettes, e-cigarettes, and chewing tobacco. If you need help quitting, ask your health care provider.  Try to reduce your stress by using methods such as yoga or meditation. If you need help reducing stress, ask your health care provider.  If you are overweight, reduce your weight to an amount that is healthy for you. Ask your health care provider for guidance about a safe weight loss goal. General instructions  Pay attention to any changes in your symptoms.  Take over-the-counter and prescription medicines only as told by your health care provider. Do not take aspirin,  ibuprofen, or other NSAIDs unless your health care provider told you to do so.  Wear loose-fitting clothing. Do not wear anything tight around your waist that causes pressure on your abdomen.  Raise (elevate) the head of your bed about 6 inches (15 cm).  Avoid bending over if this makes your symptoms worse.  Keep all follow-up visits as told by your health care provider. This is important. Contact a health care provider if:  You have: ? New symptoms. ? Unexplained weight loss. ? Difficulty swallowing or it hurts to swallow. ? Wheezing or a persistent cough. ? A hoarse voice.  Your symptoms do not improve with treatment. Get help right away if you:  Have pain in your arms, neck, jaw, teeth, or back.  Feel sweaty, dizzy, or light-headed.  Have chest pain or shortness of breath.  Vomit and your vomit looks like blood or coffee grounds.  Faint.  Have stool that is bloody or black.  Cannot swallow, drink, or eat. Summary  Gastroesophageal reflux happens when acid from the stomach flows up into the esophagus. GERD is a disease in which the reflux happens often, causes frequent or severe symptoms, or causes problems such as damage to the esophagus.  Treatment for this condition may  vary depending on how severe your symptoms are. Your health care provider may recommend diet and lifestyle changes, medicine, or surgery.  Contact a health care provider if you have new or worsening symptoms.  Take over-the-counter and prescription medicines only as told by your health care provider. Do not take aspirin, ibuprofen, or other NSAIDs unless your health care provider told you to do so.  Keep all follow-up visits as told by your health care provider. This is important. This information is not intended to replace advice given to you by your health care provider. Make sure you discuss any questions you have with your health care provider. Document Released: 07/16/2005 Document Revised:  04/14/2018 Document Reviewed: 04/14/2018 Elsevier Interactive Patient Education  2019 [employer].

## 2019-03-03 NOTE — Progress Notes (Signed)
HISTORY OF PRESENT ILLNESS:  Todd Benton is a 85 y.o. male with chronic GERD and functional constipation who schedules this telehealth medicine visit in the midst of the coronavirus pandemic regarding worsening constipation and routine follow-up of his chronic GERD.  First, patient takes lansoprazole 30 mg daily for his GERD.  On medication no symptoms.  No dysphasia.  Off medication significant breakthrough.  He is tolerating medication well without apparent issues.  He requests a refill.  Next, he reports worsening problems with his constipation.  He has undergone multiple prior colonoscopies.  Most recently 2009 with diverticulosis only.  He tells me that he has been managing his constipation with Dulcolax once or twice monthly.  Now he is using Dulcolax twice weekly.  He tolerates this therapy well but was concerned about the increased need for its use.  Otherwise he feels fine.  He does take low-dose Benefiber.  Does not consume significant water.  With his current regimen he generally has bowel movements 3 or 4 times per week.  For him, this is good.  No bleeding.  Stable weight.  REVIEW OF SYSTEMS:  All non-GI ROS negative unless otherwise stated in the HPI except for anxiety  Past Medical History:  Diagnosis Date  . Coronary artery disease    nonobstuctive  . Diverticulosis   . External hemorrhoid   . GERD (gastroesophageal reflux disease)   . Hiatal hernia   . Hx of adenomatous colonic polyps   . Hyperlipidemia   . Hypertension   . Macular degeneration   . Peptic stricture of esophagus   . Premature ventricular contractions    frequent  . Thyroid disease   . Ventricular tachycardia (HCC)    nonsustained    Past Surgical History:  Procedure Laterality Date  . AORTIC ARCH ANGIOGRAPHY N/A 12/28/2017   Procedure: AORTIC ARCH ANGIOGRAPHY;  Surgeon: [name], MD;  Location: [facility];  Service: Cardiovascular;  Laterality: N/A;  . CARDIAC CATHETERIZATION   12/15/2008   EF50%/nonobstuctive coronary arteries/lt ventricular systolic function is at the lower limits of normal/segmental wall motion abnormalities probably due to catheter-induced ectopy/no severe CAD to explain chest pain  . CARDIAC CATHETERIZATION  12/20/1999   EF65/70%/normal coronary arteries with only minor luminal irregularities consistent with someone 85 yrs old/normal lt ventricular systolic function  . CATARACT EXTRACTION Bilateral   . EYE SURGERY Right   . MOHS SURGERY Right 01/07/2019   right ear  . RIGHT/LEFT HEART CATH AND CORONARY ANGIOGRAPHY N/A 12/28/2017   Procedure: RIGHT/LEFT HEART CATH AND CORONARY ANGIOGRAPHY;  Surgeon: [name], MD;  Location: [facility];  Service: Cardiovascular;  Laterality: N/A;  . TONSILLECTOMY     at age 76    Social History KIREE DEJARNETTE  reports that he has never smoked. He has never used smokeless tobacco. He reports that he does not drink alcohol or use drugs.  family history includes Alzheimer's disease in his brother; Heart attack in his mother; Heart attack (age of onset: 26) in his father; Lymphoma (age of onset: 13) in his son; Stroke in his mother.  Allergies  Allergen Reactions  . Penicillins Hives and Swelling    Has patient had a PCN reaction causing immediate rash, facial/tongue/throat swelling, SOB or lightheadedness with hypotension: yes Has patient had a PCN reaction causing severe rash involving mucus membranes or skin necrosis: no Has patient had a PCN reaction that required hospitalization: no Has patient had a PCN reaction occurring within the  last 10 years: no -1960 If all of the above answers are "NO", then may proceed with Cephalosporin use.        PHYSICAL EXAMINATION: No physical exam with telehealth visit   ASSESSMENT:  1.  Chronic functional constipation.  Worsening 2.  Multiple prior colonoscopies.  Aged out of surveillance/screening.  Known diverticulosis 3.  GERD.  Requires PPI  to control symptoms.  Tolerating therapy without issues 4.  Multiple medical problems.  Stable.  Under the supervision of PCP  PLAN:  1.  REFILL PREVACID (lansoprazole) 30 mg daily; #30; 11 refills 2.  Reflux precautions 3.  Continue Dulcolax as needed for constipation.  I reassured the patient that this is an okay strategy. 4.  Increase fiber and water as I have directed him 5.  Routine GI follow-up 1 year.  Sooner if needed This telehealth medicine visit in the midst of the coronavirus pandemic was scheduled by the patient and consented for the the patient was in his home while I was in my office.  The encounter, which took approximately 25 minutes overall, a result in professional charges for which he is aware.

## 2019-03-08 ENCOUNTER — Other Ambulatory Visit: Payer: Self-pay | Admitting: Nurse Practitioner

## 2019-05-02 ENCOUNTER — Other Ambulatory Visit: Payer: Self-pay

## 2019-05-02 DIAGNOSIS — I472 Ventricular tachycardia: Secondary | ICD-10-CM

## 2019-05-02 DIAGNOSIS — I4729 Other ventricular tachycardia: Secondary | ICD-10-CM

## 2019-05-02 MED ORDER — METOPROLOL SUCCINATE ER 25 MG PO TB24: 25.0000 mg | ORAL_TABLET | Freq: Every day | ORAL | 2 refills | Status: DC

## 2019-07-06 ENCOUNTER — Ambulatory Visit: Payer: Medicare Other | Admitting: Cardiology

## 2019-07-25 ENCOUNTER — Other Ambulatory Visit: Payer: Self-pay

## 2019-07-25 ENCOUNTER — Encounter: Payer: Self-pay | Admitting: Cardiology

## 2019-07-25 ENCOUNTER — Ambulatory Visit: Payer: Medicare Other | Admitting: Cardiology

## 2019-07-25 VITALS — BP 129/63 | HR 62 | Temp 97.5°F | Ht 72.0 in | Wt 172.0 lb

## 2019-07-25 DIAGNOSIS — I5022 Chronic systolic (congestive) heart failure: Secondary | ICD-10-CM | POA: Insufficient documentation

## 2019-07-25 NOTE — Progress Notes (Signed)
Patient is here for follow up visit.  Subjective:   @Patient  ID: Todd Benton, male    DOB: 04-10-1930, 85 y.o.   MRN: [id number]   Chief Complaint  Patient presents with  . Cardiomyopathy  . Follow-up    6 month    HPI   85 year old Caucasian male with hypertension, hyperlipidemia, GERD, h/o NSVT, heart failure with reduced ejection fraction.  At last visit, I had continued his metoprolol succinate 25 mg daily, Entresto 24-26 mg bid, reduced spironolactone to 12.5 mg daily.  He is doing well. His workout at [employer] has stopped due to COVID related closure. He tried to walk when he can. He has lost about 6 lbs in last once year. He has noticed that he has reduced his portions.   On a separate note, he continues to have persistent sinus drainage and dry cough. This has predated his diagnsis of cardiomyopathy, and initiation of treatment.    Past Medical History:  Diagnosis Date  . Coronary artery disease    nonobstuctive  . Diverticulosis   . External hemorrhoid   . GERD (gastroesophageal reflux disease)   . Hiatal hernia   . Hx of adenomatous colonic polyps   . Hyperlipidemia   . Hypertension   . Macular degeneration   . Peptic stricture of esophagus   . Premature ventricular contractions    frequent  . Thyroid disease   . Ventricular tachycardia (HCC)    nonsustained     Past Surgical History:  Procedure Laterality Date  . AORTIC ARCH ANGIOGRAPHY N/A 12/28/2017   Procedure: AORTIC ARCH ANGIOGRAPHY;  Surgeon: [name], MD;  Location: [place] [facility];  Service: Cardiovascular;  Laterality: N/A;  . CARDIAC CATHETERIZATION  12/15/2008   EF50%/nonobstuctive coronary arteries/lt ventricular systolic function is at the lower limits of normal/segmental wall motion abnormalities probably due to catheter-induced ectopy/no severe CAD to explain chest pain  . CARDIAC CATHETERIZATION  12/20/1999   EF65/70%/normal coronary arteries with only minor  luminal irregularities consistent with someone 85 yrs old/normal lt ventricular systolic function  . CATARACT EXTRACTION Bilateral   . EYE SURGERY Right   . MOHS SURGERY Right 01/07/2019   right ear  . RIGHT/LEFT HEART CATH AND CORONARY ANGIOGRAPHY N/A 12/28/2017   Procedure: RIGHT/LEFT HEART CATH AND CORONARY ANGIOGRAPHY;  Surgeon: [name], MD;  Location: [place] [facility];  Service: Cardiovascular;  Laterality: N/A;  . TONSILLECTOMY     at age 71     Social History   Socioeconomic History  . Marital status: Married    Spouse name: Joycelyn Schmid  . Number of children: 2  . Years of education: Not on file  . Highest education level: Not on file  Occupational History  . Occupation: Retired    Fish farm manager: RETIRED    Comment: Worked in Tax inspector at [employer]  . Financial resource strain: Not on file  . Food insecurity    Worry: Not on file    Inability: Not on file  . Transportation needs    Medical: Not on file    Non-medical: Not on file  Tobacco Use  . Smoking status: Never Smoker  . Smokeless tobacco: Never Used  Substance and Sexual Activity  . Alcohol use: No    Alcohol/week: 0.0 standard drinks  . Drug use: No  . Sexual activity: Not on file  Lifestyle  . Physical activity    Days per week: Not on file  Minutes per session: Not on file  . Stress: Not on file  Relationships  . Social Herbalist on phone: Not on file    Gets together: Not on file    Attends religious service: Not on file    Active member of club or organization: Not on file    Attends meetings of clubs or organizations: Not on file    Relationship status: Not on file  . Intimate partner violence    Fear of current or ex partner: Not on file    Emotionally abused: Not on file    Physically abused: Not on file    Forced sexual activity: Not on file  Other Topics Concern  . Not on file  Social History Narrative  . Not on file     Current Outpatient  Medications on File Prior to Visit  Medication Sig Dispense Refill  . aspirin 81 MG tablet Take 81 mg by mouth daily.      Marland Kitchen ENTRESTO 24-26 MG Take 1 tablet by mouth 2 (two) times daily. 180 tablet 2  . lansoprazole (PREVACID) 30 MG capsule TAKE 1 CAPSULE BY MOUTH EVERY DAY 30 capsule 11  . levothyroxine (SYNTHROID, LEVOTHROID) 88 MCG tablet Take 88 mcg by mouth at bedtime.     . lovastatin (MEVACOR) 20 MG tablet Take 20 mg by mouth at bedtime.      . metoprolol succinate (TOPROL-XL) 25 MG 24 hr tablet Take 1 tablet (25 mg total) by mouth daily. 90 tablet 2  . Multiple Vitamins-Minerals (OCUVITE PRESERVISION PO) Take 1 capsule by mouth 2 (two) times daily.     . Probiotic Product (PROBIOTIC DAILY PO) Take 1 capsule by mouth daily.    Marland Kitchen spironolactone (ALDACTONE) 25 MG tablet Take 0.5 tablets (12.5 mg total) by mouth daily. 45 tablet 3  . Wheat Dextrin (BENEFIBER) POWD Take by mouth daily.     No current facility-administered medications on file prior to visit.     Cardiovascular studies:  EKG 07/25/2019: Sinus rhythm 62 bpm. Low voltage in precordial leads.  Poor R-wave progression.  EKG 12/31/2018: Probably sinus rhythm 58 bpm.  Borderline first degree AV block. Baseline artifact seen.   Echocardiogram 09/22/2018:  1. Left ventricle cavity is normal in size. Mild concentric hypertrophy of the left ventricle. Moderate decrease in global wall motion. Doppler evidence of grade I (impaired) diastolic dysfunction, normal LAP. Calculated EF 38%. 2. Trace mitral regurgitation. 3. Mild tricuspid regurgitation with peak gradient 19 mm of Hg. Can't calculate PA pressure as IVC is not visualized. 4. No diagnostic change c.f. echo. of 04/14/2018.  ABI 05/19/2018: This exam reveals normal perfusion of the right lower extremity (ABI 0.98) and mildly decreased perfusion of the left lower extremity, noted at the dorsalis pedis artery level (ABI 0.83). Normal triphasic waveforms of the right ankle.  Moderately abnormal waveforms of the left ankle.  Holter Monitor 48 hours 01/18/2018: NSR. Occasional PVCs and rare V-Couplets and triplets. No NSVT. Occasional  PACs. No symmptoms reported.  Cath 12/29/2017: Nonobstructive coronary artery disease LM: Normal LAD: Normal. 2nd Diag 50% proximal disease LCx: Normal RCA: Inadequate visualization. Unusually high takeoff.  Event monitor day 2 alert 12/17/2017: 14 beat nonsustained ventricular tachycardia.  Asymptomatic.   Recent labs:   CMP Latest Ref Rng & Units 12/20/2018  Glucose 65 - 99 mg/dL 96  BUN 8 - 27 mg/dL 16  Creatinine 0.76 - 1.27 mg/dL 1.08  Sodium 134 - 144 mmol/L 147(H)  Potassium 3.5 -  5.2 mmol/L 4.8  Chloride 96 - 106 mmol/L 104  CO2 20 - 29 mmol/L 25  Calcium 8.6 - 10.2 mg/dL 8.9  Total Protein 6.0 - 8.3 g/dL -  Total Bilirubin 0.3 - 1.2 mg/dL -  Alkaline Phos 39 - 117 U/L -  AST 0 - 37 U/L -  ALT 0 - 53 U/L -  eGFR non Af Amer >7m/min 62  eGFR Af Amer >637mmin 71   CBC Latest Ref Rng & Units 12/16/2008  WBC 4.0 - 10.5 K/uL 6.8  Hemoglobin 13.0 - 17.0 g/dL 14.2  Hematocrit 39.0 - 52.0 % 41.0  Platelets 150 - 400 K/uL 162   06/06/2017: Hemoglobin A1c 5.7%.  TSH 2.56. Cholesterol 145, triglycerides 87, HDL 47, LDL 82.  Review of Systems  Constitution: Negative for decreased appetite, malaise/fatigue, weight gain and weight loss.  HENT: Negative for congestion.   Eyes: Negative for visual disturbance.  Cardiovascular: Negative for chest pain, dyspnea on exertion, leg swelling, palpitations and syncope.  Respiratory: Negative for shortness of breath.   Endocrine: Negative for cold intolerance.  Hematologic/Lymphatic: Does not bruise/bleed easily.  Skin: Negative for itching and rash.  Musculoskeletal: Negative for myalgias.  Gastrointestinal: Negative for abdominal pain, nausea and vomiting.  Genitourinary: Negative for dysuria.  Neurological: Negative for dizziness and weakness.   Psychiatric/Behavioral: The patient is not nervous/anxious.   All other systems reviewed and are negative.      Objective:    Vitals:   07/25/19 1035  BP: 129/63  Pulse: 62  Temp: (!) 97.5 F (36.4 C)  SpO2: 98%     Physical Exam  Constitutional: He is oriented to person, place, and time. He appears well-developed and well-nourished. No distress.  appears younger than stated age  HENT:  Head: Normocephalic and atraumatic.  Eyes: Pupils are equal, round, and reactive to light. Conjunctivae are normal.  Neck: No JVD present.  Cardiovascular: Normal rate, regular rhythm and intact distal pulses.  Pulmonary/Chest: Effort normal and breath sounds normal. He has no wheezes. He has no rales.  Abdominal: Soft. Bowel sounds are normal. There is no rebound.  Musculoskeletal:        General: Edema (Minimal leg edmea RLE) present.  Lymphadenopathy:    He has no cervical adenopathy.  Neurological: He is alert and oriented to person, place, and time. No cranial nerve deficit.  Skin: Skin is warm and dry.  Psychiatric: He has a normal mood and affect.  Nursing note and vitals reviewed.      Assessment & Recommendations:   [age]ear old Caucasian male with hypertension, hyperlipidemia, GERD, h/o NSVT, heart failure with reduced ejection fraction.   HFrEF: Nonischemic. Continue metoprolol succinate 25 mg daily, Entresto 24-26 mg bid, spironolactone to 12.5 mg daily. Given his advanced age, occasional low blood pressure, I do not think he will tolerate higher dosages of above heart failure therapy. Ifhe does not get lab check at upcoming PCP visit, I will check BMP.  Postnasal drip: I do not think this is related to his HFrEF or medications. Recommend following up with PCP/ENT.  Claudication: Mildly reduced Lt ABI. Continue medical tharapy.  I will see him back in 6 months.  MaNigel MormonMD [facility] Pager: [phone]: [phone] no  answer Cell [phone]

## 2019-09-30 ENCOUNTER — Other Ambulatory Visit: Payer: Self-pay | Admitting: Cardiology

## 2019-12-04 ENCOUNTER — Ambulatory Visit: Payer: Medicare Other | Attending: Internal Medicine

## 2019-12-04 DIAGNOSIS — Z23 Encounter for immunization: Secondary | ICD-10-CM | POA: Insufficient documentation

## 2019-12-04 NOTE — Progress Notes (Signed)
   Covid-19 Vaccination Clinic  Name:  JARICK BASISTA    MRN: [id number] DOB: 1930-04-24  12/04/2019  Mr. Golberg was observed post Covid-19 immunization for 15 minutes without incidence. He was provided with Vaccine Information Sheet and instruction to access the V-Safe system.   Mr. Dacquisto was instructed to call 911 with any severe reactions post vaccine: Marland Kitchen Difficulty breathing  . Swelling of your face and throat  . A fast heartbeat  . A bad rash all over your body  . Dizziness and weakness    Immunizations Administered    Name Date Dose VIS Date Route   Pfizer COVID-19 Vaccine 12/04/2019  2:20 PM 0.3 mL 09/30/2019 Intramuscular   Manufacturer: [place]   Lot: [id number]   [place]: [id number]

## 2019-12-27 ENCOUNTER — Ambulatory Visit: Payer: Medicare Other | Attending: Internal Medicine

## 2019-12-27 DIAGNOSIS — Z23 Encounter for immunization: Secondary | ICD-10-CM

## 2019-12-27 NOTE — Progress Notes (Signed)
   Covid-19 Vaccination Clinic  Name:  Todd Benton    MRN: [id number] DOB: 1929/12/28  12/27/2019  [name] was observed post Covid-19 immunization for 15 minutes without incident. He was provided with Vaccine Information Sheet and instruction to access the V-Safe system.   [name] was instructed to call 911 with any severe reactions post vaccine: Marland Kitchen Difficulty breathing  . Swelling of face and throat  . A fast heartbeat  . A bad rash all over body  . Dizziness and weakness   Immunizations Administered    Name Date Dose VIS Date Route   Pfizer COVID-19 Vaccine 12/27/2019  3:03 PM 0.3 mL 09/30/2019 Intramuscular   Manufacturer: [place]   Lot: [id number]   [place]: [id number]

## 2019-12-28 ENCOUNTER — Ambulatory Visit: Payer: Medicare Other

## 2020-01-23 ENCOUNTER — Other Ambulatory Visit: Payer: Self-pay

## 2020-01-23 ENCOUNTER — Encounter: Payer: Self-pay | Admitting: Cardiology

## 2020-01-23 ENCOUNTER — Ambulatory Visit: Payer: Medicare Other | Admitting: Cardiology

## 2020-01-23 VITALS — BP 135/80 | HR 63 | Temp 98.0°F | Ht 72.0 in | Wt 178.0 lb

## 2020-01-23 DIAGNOSIS — I429 Cardiomyopathy, unspecified: Secondary | ICD-10-CM

## 2020-01-23 DIAGNOSIS — I5022 Chronic systolic (congestive) heart failure: Secondary | ICD-10-CM

## 2020-01-23 NOTE — Progress Notes (Signed)
Patient is here for follow up visit.  Subjective:   @Patient  ID: Todd Benton, male    DOB: 01-Nov-1929, 86 y.o.   MRN: [id number]   Chief Complaint  Patient presents with  . Cardiomyopathy    HPI   87 year old Caucasian male with hypertension, hyperlipidemia, GERD, h/o NSVT, heart failure with reduced ejection fraction.  He is doing well, denies chest pain, shortness of breath, palpitations, leg edema, orthopnea, PND, TIA/syncope. He has noticed increasing constipation, and asks if it could be related to any of his medications.    Current Outpatient Medications on File Prior to Visit  Medication Sig Dispense Refill  . aspirin 81 MG tablet Take 81 mg by mouth daily.      Marland Kitchen ENTRESTO 24-26 MG Take 1 tablet by mouth 2 (two) times daily. 180 tablet 2  . lansoprazole (PREVACID) 30 MG capsule TAKE 1 CAPSULE BY MOUTH EVERY DAY 30 capsule 11  . levothyroxine (SYNTHROID, LEVOTHROID) 88 MCG tablet Take 88 mcg by mouth at bedtime.     . lovastatin (MEVACOR) 20 MG tablet Take 20 mg by mouth at bedtime.      . metoprolol succinate (TOPROL-XL) 25 MG 24 hr tablet Take 1 tablet (25 mg total) by mouth daily. 90 tablet 2  . Multiple Vitamins-Minerals (OCUVITE PRESERVISION PO) Take 1 capsule by mouth 2 (two) times daily.     . Probiotic Product (PROBIOTIC DAILY PO) Take 1 capsule by mouth daily.    Marland Kitchen spironolactone (ALDACTONE) 25 MG tablet Take 0.5 tablets (12.5 mg total) by mouth daily. 45 tablet 3  . Wheat Dextrin (BENEFIBER) POWD Take by mouth daily.     No current facility-administered medications on file prior to visit.    Cardiovascular studies:  EKG 01/23/2020: Sinus rhythm 65 bpm. Low voltage.   Echocardiogram 09/22/2018:  1. Left ventricle cavity is normal in size. Mild concentric hypertrophy of the left ventricle. Moderate decrease in global wall motion. Doppler evidence of grade I (impaired) diastolic dysfunction, normal LAP. Calculated EF 38%. 2. Trace mitral regurgitation. 3.  Mild tricuspid regurgitation with peak gradient 19 mm of Hg. Can't calculate PA pressure as IVC is not visualized. 4. No diagnostic change c.f. echo. of 04/14/2018.  ABI 05/19/2018: This exam reveals normal perfusion of the right lower extremity (ABI 0.98) and mildly decreased perfusion of the left lower extremity, noted at the dorsalis pedis artery level (ABI 0.83). Normal triphasic waveforms of the right ankle. Moderately abnormal waveforms of the left ankle.  Holter Monitor 48 hours 01/18/2018: NSR. Occasional PVCs and rare V-Couplets and triplets. No NSVT. Occasional  PACs. No symmptoms reported.  Cath 12/29/2017: Nonobstructive coronary artery disease LM: Normal LAD: Normal. 2nd Diag 50% proximal disease LCx: Normal RCA: Inadequate visualization. Unusually high takeoff.  Event monitor day 2 alert 12/17/2017: 14 beat nonsustained ventricular tachycardia.  Asymptomatic.   Recent labs: 09/27/2019: Glucose 120, BUN/Cr 18/0.9. EGFR 62 HbA1C 5.8% Chol 147, TG 85, HDL 46, LDL 85 TSH 1.3 normal   Review of Systems  Cardiovascular: Negative for chest pain, dyspnea on exertion, leg swelling, palpitations and syncope.       Objective:    Vitals:   01/23/20 0931  BP: 135/80  Pulse: 63  Temp: 98 F (36.7 C)  SpO2: 97%     Physical Exam  Constitutional: He appears well-developed and well-nourished.  Neck: No JVD present.  Cardiovascular: Normal rate, regular rhythm, normal heart sounds and intact distal pulses.  No murmur heard. Pulmonary/Chest: Effort normal  and breath sounds normal. He has no wheezes. He has no rales.  Musculoskeletal:        General: No edema.  Nursing note and vitals reviewed.      Assessment & Recommendations:   86 year old Caucasian male with hypertension, hyperlipidemia, GERD, h/o NSVT, heart failure with reduced ejection fraction.   HFrEF: Nonischemic. NYHA class I-II. Continue metoprolol succinate 25 mg daily, Entresto 24-26 mg bid. Given  that he is euvolumic, I am okay with stopping spironolactone 12.5 mg daily. This may help with his constipation.  Claudication: Mildly reduced Lt ABI. No critical limb ischemia expected. I will stop his Aspirin, as benefits do not outweigh risks of bleeding.  F/u in 6 months.  [name], MD [facility] Pager: [phone] Office: [phone] If no answer Cell [phone]

## 2020-02-08 ENCOUNTER — Other Ambulatory Visit: Payer: Self-pay | Admitting: Cardiology

## 2020-02-08 DIAGNOSIS — I472 Ventricular tachycardia: Secondary | ICD-10-CM

## 2020-02-08 DIAGNOSIS — I4729 Other ventricular tachycardia: Secondary | ICD-10-CM

## 2020-03-27 ENCOUNTER — Other Ambulatory Visit: Payer: Self-pay | Admitting: Cardiology

## 2020-03-27 DIAGNOSIS — I5022 Chronic systolic (congestive) heart failure: Secondary | ICD-10-CM

## 2020-04-19 ENCOUNTER — Encounter: Payer: Self-pay | Admitting: Internal Medicine

## 2020-04-19 ENCOUNTER — Ambulatory Visit: Payer: Medicare Other | Admitting: Internal Medicine

## 2020-04-19 VITALS — BP 126/62 | HR 68 | Ht 73.0 in | Wt 181.0 lb

## 2020-04-19 DIAGNOSIS — K219 Gastro-esophageal reflux disease without esophagitis: Secondary | ICD-10-CM

## 2020-04-19 DIAGNOSIS — K5900 Constipation, unspecified: Secondary | ICD-10-CM

## 2020-04-19 MED ORDER — LANSOPRAZOLE 30 MG PO CPDR: DELAYED_RELEASE_CAPSULE | ORAL | 3 refills | Status: DC

## 2020-04-19 NOTE — Progress Notes (Signed)
HISTORY OF PRESENT ILLNESS:  Todd Benton is a 86 y.o. male past medical history as to see below who presents today for routine annual follow-up regarding management of his chronic functional constipation and chronic GERD.  He was last evaluated Mar 03, 2019.  See that dictation for details.  Patient tells me that his constipation has been much better using Dulcolax on a daily basis.  He takes 1 at night.  He will have a bowel movement the following day 90% of the time.  Occasionally 2 bowel movements.  He is pleased.  Experiencing no side effects.  He is also increased fiber and water intake as we had recommended previously.  Next, chronic GERD he continues on lansoprazole 30 mg daily.  On this regimen he does well.  Off medication he will experience severe recurrent symptoms within 3 days.  He denies dysphagia.  He does request medication refill review of blood work from 18th 2021 shows unremarkable comprehensive metabolic panel with normal liver tests and normal renal function.  His last complete colonoscopy was performed in 2009 and revealed diverticulosis.  REVIEW OF SYSTEMS:  All non-GI ROS negative unless otherwise stated in the HPI.  Past Medical History:  Diagnosis Date   Coronary artery disease    nonobstuctive   Diverticulosis    External hemorrhoid    GERD (gastroesophageal reflux disease)    Hiatal hernia    Hx of adenomatous colonic polyps    Hyperlipidemia    Hypertension    Macular degeneration    Peptic stricture of esophagus    Premature ventricular contractions    frequent   Thyroid disease    Ventricular tachycardia (HCC)    nonsustained    Past Surgical History:  Procedure Laterality Date   AORTIC ARCH ANGIOGRAPHY N/A 12/28/2017   Procedure: AORTIC ARCH ANGIOGRAPHY;  Surgeon: [name], MD;  Location: [place] [facility];  Service: Cardiovascular;  Laterality: N/A;   CARDIAC CATHETERIZATION  12/15/2008   EF50%/nonobstuctive coronary  arteries/lt ventricular systolic function is at the lower limits of normal/segmental wall motion abnormalities probably due to catheter-induced ectopy/no severe CAD to explain chest pain   CARDIAC CATHETERIZATION  12/20/1999   EF65/70%/normal coronary arteries with only minor luminal irregularities consistent with someone 86 yrs old/normal lt ventricular systolic function   CATARACT EXTRACTION Bilateral    EYE SURGERY Right    MOHS SURGERY Right 01/07/2019   right ear   RIGHT/LEFT HEART CATH AND CORONARY ANGIOGRAPHY N/A 12/28/2017   Procedure: RIGHT/LEFT HEART CATH AND CORONARY ANGIOGRAPHY;  Surgeon: [name], MD;  Location: [place] [facility];  Service: Cardiovascular;  Laterality: N/A;   TONSILLECTOMY     at age 38    [place]  reports that he has never smoked. He has never used smokeless tobacco. He reports that he does not drink alcohol and does not use drugs.  family history includes Alzheimer's disease in his brother; Heart attack in his mother; Heart attack (age of onset: 27) in his father; Lymphoma (age of onset: 50) in his son; Stroke in his mother.  Allergies  Allergen Reactions   Penicillins Hives and Swelling    Has patient had a PCN reaction causing immediate rash, facial/tongue/throat swelling, SOB or lightheadedness with hypotension: yes Has patient had a PCN reaction causing severe rash involving mucus membranes or skin necrosis: no Has patient had a PCN reaction that required hospitalization: no Has patient had a PCN reaction occurring within the last 10  years: no -1960 If all of the above answers are "NO", then may proceed with Cephalosporin use.        PHYSICAL EXAMINATION: Vital signs: BP 126/62    Pulse 68    Ht 6\' 1"  (1.854 m)    Wt 181 lb (82.1 kg)    SpO2 97%    BMI 23.88 kg/m   Constitutional: generally well-appearing, no acute distress Psychiatric: alert and oriented x3, cooperative Eyes: extraocular movements intact,  anicteric, conjunctiva pink Mouth: oral pharynx moist, no lesions Neck: supple no lymphadenopathy Cardiovascular: heart regular rate and rhythm, no murmur Lungs: clear to auscultation bilaterally Abdomen: soft, nontender, nondistended, no obvious ascites, no peritoneal signs, normal bowel sounds, no organomegaly Rectal: Omitted Extremities: no clubbing, cyanosis, or lower extremity edema bilaterally Skin: no lesions on visible extremities Neuro: No focal deficits. No asterixis.    ASSESSMENT:  1.  Chronic GERD.  Requires PPI therapy in order to control symptoms 2.  Chronic functional constipation.  Improved with regular Dulcolax therapy, fiber, and water 3.  Colonoscopy 2009 with diverticulosis   PLAN:  1.  Reflux precautions 2.  Continue lansoprazole 30 mg daily.  Prescription refilled for 1 year.  Medication side effects and risks reviewed. 3.  Continue bowel regimen for constipation 4.  Routine GI follow-up 1 year.  Sooner if needed

## 2020-04-19 NOTE — Patient Instructions (Signed)
If you are age 84 or older, your body mass index should be between 23-30. Your Body mass index is 23.88 kg/m. If this is out of the aforementioned range listed, please consider follow up with your Primary Care Provider.  If you are age 90 or younger, your body mass index should be between 19-25. Your Body mass index is 23.88 kg/m. If this is out of the aformentioned range listed, please consider follow up with your Primary Care Provider.    It was a pleasure to see you today!  Dr. Henrene Pastor

## 2020-07-25 NOTE — Progress Notes (Signed)
Patient is here for follow up visit.  Subjective:   @Patient  ID: Todd Benton, male    DOB: [birth year], 86 y.o.   MRN: [id number]   Chief Complaint  Patient presents with  . Congestive Heart Failure  . HFrEF  . Follow-up    6 month    HPI   86 year old Caucasian male with hypertension, hyperlipidemia, GERD, h/o NSVT, heart failure with reduced ejection fraction.  He is doing well, denies chest pain, shortness of breath, palpitations, leg edema, orthopnea, PND, TIA/syncope.  In the past, spironolactone was stopped, as he noted increased constipation after starting this medication.  Patient has noticed that his weight has steadily increased since then, most days are known 178 pounds at home.   Current Outpatient Medications on File Prior to Visit  Medication Sig Dispense Refill  . ENTRESTO 24-26 MG Take 1 tablet by mouth 2 (two) times daily. 180 tablet 2  . lansoprazole (PREVACID) 30 MG capsule TAKE 1 CAPSULE BY MOUTH EVERY DAY 90 capsule 3  . levothyroxine (SYNTHROID, LEVOTHROID) 88 MCG tablet Take 88 mcg by mouth at bedtime.     . lovastatin (MEVACOR) 20 MG tablet Take 20 mg by mouth at bedtime.      . Magnesium Hydroxide (DULCOLAX PO) Take 1 tablet by mouth at bedtime.    . metoprolol succinate (TOPROL-XL) 25 MG 24 hr tablet TAKE 1 TABLET BY MOUTH EVERY DAY 90 tablet 2  . Multiple Vitamins-Minerals (OCUVITE PRESERVISION PO) Take 1 capsule by mouth 2 (two) times daily.     . Probiotic Product (PROBIOTIC DAILY PO) Take 1 capsule by mouth daily.     No current facility-administered medications on file prior to visit.    Cardiovascular studies:  EKG 07/26/2020: Sinus rhythm 66 bpm Low voltage in precordial leads Poor R-wave progression  Echocardiogram 09/22/2018:  1. Left ventricle cavity is normal in size. Mild concentric hypertrophy of the left ventricle. Moderate decrease in global wall motion. Doppler evidence of grade I (impaired) diastolic dysfunction, normal LAP.  Calculated EF 38%. 2. Trace mitral regurgitation. 3. Mild tricuspid regurgitation with peak gradient 19 mm of Hg. Can't calculate PA pressure as IVC is not visualized. 4. No diagnostic change c.f. echo. of 04/14/2018.  ABI 05/19/2018: This exam reveals normal perfusion of the right lower extremity (ABI 0.98) and mildly decreased perfusion of the left lower extremity, noted at the dorsalis pedis artery level (ABI 0.83). Normal triphasic waveforms of the right ankle. Moderately abnormal waveforms of the left ankle.  Holter Monitor 48 hours 01/18/2018: NSR. Occasional PVCs and rare V-Couplets and triplets. No NSVT. Occasional  PACs. No symmptoms reported.  Cath 12/29/2017: Nonobstructive coronary artery disease LM: Normal LAD: Normal. 2nd Diag 50% proximal disease LCx: Normal RCA: Inadequate visualization. Unusually high takeoff.  Event monitor day 2 alert 12/17/2017: 14 beat nonsustained ventricular tachycardia.  Asymptomatic.   Recent labs: 02/25/2020: Glucose 123, BUN/Cr 16/0.9. EGFR 62. HbA1C 5.9% Chol 150, TG 83, HDL 48, LDL 86 TSH 1.7 normal  09/27/2019: Glucose 120, BUN/Cr 18/0.9. EGFR 62 HbA1C 5.8% Chol 147, TG 85, HDL 46, LDL 85 TSH 1.3 normal   Review of Systems  Cardiovascular: Negative for chest pain, dyspnea on exertion, leg swelling, palpitations and syncope.       Objective:    Vitals:   07/26/20 0934  BP: 120/66  Pulse: 67  Resp: 16  SpO2: 97%     Physical Exam Vitals and nursing note reviewed.  Constitutional:  Appearance: He is well-developed.  Neck:     Vascular: No JVD.  Cardiovascular:     Rate and Rhythm: Normal rate and regular rhythm.     Pulses: Intact distal pulses.     Heart sounds: Normal heart sounds. No murmur heard.   Pulmonary:     Effort: Pulmonary effort is normal.     Breath sounds: Normal breath sounds. No wheezing or rales.        Assessment & Recommendations:   86 year old Caucasian male with hypertension,  hyperlipidemia, GERD, h/o NSVT, heart failure with reduced ejection fraction.  HFrEF: Nonischemic. NYHA class I-II. Continue metoprolol succinate 25 mg daily, Entresto 24-26 mg bid. Recommend lasix for as needed use, should they decrease > 180 lb.  Claudication: Mildly reduced Lt ABI. No critical limb ischemia expected. I will stop his Aspirin, as benefits do not outweigh risks of bleeding.   F/u in 6 months.  [name], MD [facility] Pager: [phone] Office: [phone] If no answer Cell [phone]

## 2020-07-26 ENCOUNTER — Encounter: Payer: Self-pay | Admitting: Cardiology

## 2020-07-26 ENCOUNTER — Other Ambulatory Visit: Payer: Self-pay

## 2020-07-26 ENCOUNTER — Ambulatory Visit: Payer: Medicare Other | Admitting: Cardiology

## 2020-07-26 VITALS — BP 120/66 | HR 67 | Resp 16 | Ht 73.0 in | Wt 182.0 lb

## 2020-07-26 DIAGNOSIS — I5022 Chronic systolic (congestive) heart failure: Secondary | ICD-10-CM

## 2020-07-26 MED ORDER — FUROSEMIDE 20 MG PO TABS: 20.0000 mg | ORAL_TABLET | Freq: Every day | ORAL | 3 refills | Status: DC | PRN

## 2020-09-10 ENCOUNTER — Other Ambulatory Visit: Payer: Self-pay

## 2020-09-10 MED ORDER — ENTRESTO 24-26 MG PO TABS: 1.0000 | ORAL_TABLET | Freq: Two times a day (BID) | ORAL | 2 refills | Status: DC

## 2020-10-23 ENCOUNTER — Other Ambulatory Visit: Payer: Self-pay | Admitting: Cardiology

## 2020-10-23 DIAGNOSIS — I5022 Chronic systolic (congestive) heart failure: Secondary | ICD-10-CM

## 2020-11-07 ENCOUNTER — Other Ambulatory Visit: Payer: Self-pay | Admitting: Cardiology

## 2020-11-07 DIAGNOSIS — I472 Ventricular tachycardia: Secondary | ICD-10-CM

## 2020-11-07 DIAGNOSIS — I4729 Other ventricular tachycardia: Secondary | ICD-10-CM

## 2021-01-24 NOTE — Progress Notes (Signed)
Rescheduled

## 2021-01-25 ENCOUNTER — Ambulatory Visit: Payer: Medicare Other | Admitting: Cardiology

## 2021-01-25 DIAGNOSIS — I5022 Chronic systolic (congestive) heart failure: Secondary | ICD-10-CM

## 2021-02-04 ENCOUNTER — Encounter: Payer: Self-pay | Admitting: Cardiology

## 2021-02-04 ENCOUNTER — Ambulatory Visit: Payer: Medicare Other | Admitting: Cardiology

## 2021-02-04 ENCOUNTER — Other Ambulatory Visit: Payer: Self-pay

## 2021-02-04 VITALS — BP 123/68 | HR 69 | Temp 98.2°F | Resp 16 | Ht 73.0 in | Wt 180.0 lb

## 2021-02-04 DIAGNOSIS — I5022 Chronic systolic (congestive) heart failure: Secondary | ICD-10-CM

## 2021-02-04 NOTE — Progress Notes (Signed)
Patient is here for follow up visit.  Subjective:   _0  ID: Todd Benton, male    DOB: 01-01-1930, 87 y.o.   MRN: [id number]   Chief Complaint  Patient presents with  . Cardiomyopathy  . Follow-up    6 month    HPI   87 year old Caucasian male with hypertension, hyperlipidemia, GERD, h/o NSVT, heart failure with reduced ejection fraction.  Patient is doing well, denies chest pain, shortness of breath, palpitations, leg edema, orthopnea, PND, TIA/syncope.  In the past, spironolactone was stopped, as he noted increased constipation after starting this medication. He is tolerating current medications well.    Current Outpatient Medications on File Prior to Visit  Medication Sig Dispense Refill  . ENTRESTO 24-26 MG Take 1 tablet by mouth 2 (two) times daily. 180 tablet 2  . lansoprazole (PREVACID) 30 MG capsule TAKE 1 CAPSULE BY MOUTH EVERY DAY 90 capsule 3  . levothyroxine (SYNTHROID, LEVOTHROID) 88 MCG tablet Take 88 mcg by mouth at bedtime.    . lovastatin (MEVACOR) 20 MG tablet Take 20 mg by mouth at bedtime.    . Magnesium Hydroxide (DULCOLAX PO) Take 1 tablet by mouth at bedtime.    . metoprolol succinate (TOPROL-XL) 25 MG 24 hr tablet TAKE 1 TABLET BY MOUTH EVERY DAY 90 tablet 2  . Multiple Vitamins-Minerals (OCUVITE PRESERVISION PO) Take 1 capsule by mouth 2 (two) times daily.    . Probiotic Product (PROBIOTIC DAILY PO) Take 1 capsule by mouth daily.     No current facility-administered medications on file prior to visit.    Cardiovascular studies:  EKG 02/04/2021: Sinus rhythm 64 bpm  Low voltage Possible old anterior infarct  Echocardiogram 09/22/2018:  1. Left ventricle cavity is normal in size. Mild concentric hypertrophy of the left ventricle. Moderate decrease in global wall motion. Doppler evidence of grade I (impaired) diastolic dysfunction, normal LAP. Calculated EF 38%. 2. Trace mitral regurgitation. 3. Mild tricuspid regurgitation with peak  gradient 19 mm of Hg. Can't calculate PA pressure as IVC is not visualized. 4. No diagnostic change c.f. echo. of 04/14/2018.  ABI 05/19/2018: This exam reveals normal perfusion of the right lower extremity (ABI 0.98) and mildly decreased perfusion of the left lower extremity, noted at the dorsalis pedis artery level (ABI 0.83). Normal triphasic waveforms of the right ankle. Moderately abnormal waveforms of the left ankle.  Holter Monitor 48 hours 01/18/2018: NSR. Occasional PVCs and rare V-Couplets and triplets. No NSVT. Occasional  PACs. No symmptoms reported.  Coronary angiogram 12/29/2017: Nonobstructive coronary artery disease LM: Normal LAD: Normal. 2nd Diag 50% proximal disease LCx: Normal RCA: Inadequate visualization. Unusually high takeoff.  Event monitor day 2 alert 12/17/2017: 14 beat nonsustained ventricular tachycardia.  Asymptomatic.   Recent labs: 09/11/2020: Glucose 123, BUN/Cr 14/0.9. EGFR 76.  HbA1C 5.9% Chol 149, TG 80, HDL 45, LDL 88 TSH 2.1 normal  02/25/2020: Glucose 123, BUN/Cr 16/0.9. EGFR 62. HbA1C 5.9% Chol 150, TG 83, HDL 48, LDL 86 TSH 1.7 normal  09/27/2019: Glucose 120, BUN/Cr 18/0.9. EGFR 62 HbA1C 5.8% Chol 147, TG 85, HDL 46, LDL 85 TSH 1.3 normal   Review of Systems  Cardiovascular: Negative for chest pain, dyspnea on exertion, leg swelling, palpitations and syncope.       Objective:    Vitals:   02/04/21 1506  BP: 123/68  Pulse: 69  Resp: 16  Temp: 98.2 F (36.8 C)  SpO2: 98%     Physical Exam Vitals and nursing note reviewed.  Constitutional:      Appearance: He is well-developed.  Neck:     Vascular: No JVD.  Cardiovascular:     Rate and Rhythm: Normal rate and regular rhythm.     Pulses: Intact distal pulses.     Heart sounds: Normal heart sounds. No murmur heard.   Pulmonary:     Effort: Pulmonary effort is normal.     Breath sounds: Normal breath sounds. No wheezing or rales.  Musculoskeletal:     Right lower  leg: Edema (Trace) present.     Left lower leg: No edema.        Assessment & Recommendations:   87 year old Caucasian male with hypertension, hyperlipidemia, GERD, h/o NSVT, heart failure with reduced ejection fraction.  HFrEF: Nonischemic. NYHA class I-II. Continue metoprolol succinate 25 mg daily, Entresto 24-26 mg bid. Recommend lasix for as needed use in case of worsening leg swelling  Claudication: Mildly reduced Lt ABI. No critical limb ischemia expected. I will stop his Aspirin, as benefits do not outweigh risks of bleeding.  F/u in 1 year  Todd Mormon, MD [facility] [place] Pager: [phone] Office: [phone] If no answer Cell [phone]

## 2021-05-01 ENCOUNTER — Other Ambulatory Visit: Payer: Self-pay | Admitting: Internal Medicine

## 2021-05-13 ENCOUNTER — Encounter: Payer: Self-pay | Admitting: Internal Medicine

## 2021-05-13 ENCOUNTER — Ambulatory Visit: Payer: Medicare Other | Admitting: Internal Medicine

## 2021-05-13 VITALS — BP 100/70 | HR 66 | Ht 73.0 in | Wt 178.2 lb

## 2021-05-13 DIAGNOSIS — K219 Gastro-esophageal reflux disease without esophagitis: Secondary | ICD-10-CM | POA: Diagnosis not present

## 2021-05-13 DIAGNOSIS — K5900 Constipation, unspecified: Secondary | ICD-10-CM

## 2021-05-13 MED ORDER — LANSOPRAZOLE 30 MG PO CPDR: DELAYED_RELEASE_CAPSULE | ORAL | 3 refills | Status: DC

## 2021-05-13 NOTE — Patient Instructions (Signed)
If you are age 85 or older, your body mass index should be between 23-30. Your Body mass index is 23.51 kg/m. If this is out of the aforementioned range listed, please consider follow up with your Primary Care Provider. _________________________________________________________ The [facility] providers would like to encourage you to use [facility] to communicate with providers for non-urgent requests or questions.  Due to long hold times on the telephone, sending your provider a message by [facility] may be a faster and more efficient way to get a response.  Please allow 48 business hours for a response.  Please remember that this is for non-urgent requests.   We have sent the following medications to your pharmacy for you to pick up at your convenience:  Prevacid (lansoprazole) '30mg'$  once daily.  We will see you back in the office for follow up appointment in 1 year (July 2023). We will contact you to schedule this appointment.  Thank you for entrusting me with your care and choosing [facility].  Dr Henrene Pastor

## 2021-05-13 NOTE — Progress Notes (Signed)
HISTORY OF PRESENT ILLNESS:  Todd Benton is a 87 y.o. male with past medical history as listed below who presents today for annual follow-up regarding management of his chronic GERD complicated by peptic stricture and chronic functional constipation.  He was last evaluated April 19, 2020.  Patient.  At that time he his GERD was controlled with lansoprazole 30 mg daily.  Chronic constipation.  Managed with combination Dulcolax, fiber, and water.  Last colonoscopy 2009 with diverticulosis.  He presents today for follow-up.  Patient continues to appear confused.  Result is effective in controlling reflux symptoms.  He will develop reflux symptoms if he is off the medication for 3 days.  He brings with him a pharmacy alert regarding potential drug interactions between his PPI and Synthroid.  Last upper endoscopy with Dr. Velora Benton 2005.  Peptic stricture dilated at that time.  Patient does report to me that he has lost few pounds since his last visit.  Associated with this has been decreased appetite/p.o. intake.  GI review of systems is otherwise negative  REVIEW OF SYSTEMS:  All non-GI ROS negative unless otherwise stated in the HPI.  Past Medical History:  Diagnosis Date   Coronary artery disease    nonobstuctive   Diverticulosis    External hemorrhoid    GERD (gastroesophageal reflux disease)    Hiatal hernia    Hx of adenomatous colonic polyps    Hyperlipidemia    Hypertension    Macular degeneration    Peptic stricture of esophagus    Premature ventricular contractions    frequent   Thyroid disease    Ventricular tachycardia (HCC)    nonsustained    Past Surgical History:  Procedure Laterality Date   AORTIC ARCH ANGIOGRAPHY N/A 12/28/2017   Procedure: AORTIC ARCH ANGIOGRAPHY;  Surgeon: Todd Mormon, MD;  Location: [facility];  Service: Cardiovascular;  Laterality: N/A;   CARDIAC CATHETERIZATION  12/15/2008   EF50%/nonobstuctive coronary arteries/lt ventricular  systolic function is at the lower limits of normal/segmental wall motion abnormalities probably due to catheter-induced ectopy/no severe CAD to explain chest pain   CARDIAC CATHETERIZATION  12/20/1999   EF65/70%/normal coronary arteries with only minor luminal irregularities consistent with someone 87 yrs old/normal lt ventricular systolic function   CATARACT EXTRACTION Bilateral    EYE SURGERY Right    MOHS SURGERY Right 01/07/2019   right ear   RIGHT/LEFT HEART CATH AND CORONARY ANGIOGRAPHY N/A 12/28/2017   Procedure: RIGHT/LEFT HEART CATH AND CORONARY ANGIOGRAPHY;  Surgeon: Todd Mormon, MD;  [facility];  Service: Cardiovascular;  Laterality: N/A;   TONSILLECTOMY     at age 41    [place]  reports that he has never smoked. He has never used smokeless tobacco. He reports that he does not drink alcohol and does not use drugs.  family history includes Alzheimer's disease in his brother; Heart attack in his mother; Heart attack (age of onset: 44) in his father; Lymphoma (age of onset: 52) in his son; Stroke in his mother.  Allergies  Allergen Reactions   Penicillins Hives and Swelling    Has patient had a PCN reaction causing immediate rash, facial/tongue/throat swelling, SOB or lightheadedness with hypotension: yes Has patient had a PCN reaction causing severe rash involving mucus membranes or skin necrosis: no Has patient had a PCN reaction that required hospitalization: no Has patient had a PCN reaction occurring within the last 10 years: no -1960 If all of the  above answers are "NO", then may proceed with Cephalosporin use.        PHYSICAL EXAMINATION: Vital signs: BP 100/70   Pulse 66   Ht '6\' 1"'$  (1.854 m)   Wt 178 lb 3.2 oz (80.8 kg)   SpO2 96%   BMI 23.51 kg/m   Constitutional: generally well-appearing, no acute distress Psychiatric: alert and oriented x3, cooperative Eyes: extraocular movements intact, anicteric, conjunctiva  pink Mouth: oral pharynx moist, no lesions Neck: supple no lymphadenopathy Cardiovascular: heart regular rate and rhythm, no murmur Lungs: clear to auscultation bilaterally Abdomen: soft, nontender, nondistended, no obvious ascites, no peritoneal signs, normal bowel sounds, no organomegaly Rectal: Omitted Extremities: no clubbing, cyanosis, or lower extremity edema bilaterally Skin: no lesions on visible extremities Neuro: No focal deficits.  Cranial nerves intact  ASSESSMENT:  1.  GERD complicated by peptic stricture.  Asymptomatic post remote dilation on PPI 2.  Chronic constipation.  Managed with Dulcolax, fiber, and water 3.  Colonoscopy 2009 with diverticulosis 4.  Upper endoscopy 2005 and peptic stricture dilated 5.  Questions regarding drug drug interactions  PLAN:  1.  Reflux precautions 2.  Continue lansoprazole 30 mg daily.  Multiple refills.  Medication risks reviewed.  Drug interaction questions addressed. 3.  Continue bowel regimen 4.  Routine GI follow-up in 1 year.

## 2021-08-04 ENCOUNTER — Other Ambulatory Visit: Payer: Self-pay | Admitting: Cardiology

## 2021-08-04 DIAGNOSIS — I4729 Other ventricular tachycardia: Secondary | ICD-10-CM

## 2021-08-30 ENCOUNTER — Other Ambulatory Visit: Payer: Self-pay

## 2021-08-30 MED ORDER — ENTRESTO 24-26 MG PO TABS: 1.0000 | ORAL_TABLET | Freq: Two times a day (BID) | ORAL | 2 refills | Status: DC

## 2021-09-26 ENCOUNTER — Other Ambulatory Visit: Payer: Self-pay | Admitting: Family Medicine

## 2021-09-26 DIAGNOSIS — M8580 Other specified disorders of bone density and structure, unspecified site: Secondary | ICD-10-CM

## 2021-10-08 ENCOUNTER — Other Ambulatory Visit: Payer: Self-pay | Admitting: Family Medicine

## 2021-10-08 DIAGNOSIS — M8580 Other specified disorders of bone density and structure, unspecified site: Secondary | ICD-10-CM

## 2021-10-17 ENCOUNTER — Ambulatory Visit
Admission: RE | Admit: 2021-10-17 | Discharge: 2021-10-17 | Disposition: A | Discharge status: Home / Self Care | Payer: Medicare Other | Source: Ambulatory Visit | Attending: Family Medicine | Admitting: Family Medicine

## 2021-10-17 ENCOUNTER — Encounter (INDEPENDENT_AMBULATORY_CARE_PROVIDER_SITE_OTHER): Payer: Medicare Other | Admitting: Ophthalmology

## 2021-10-17 DIAGNOSIS — M8580 Other specified disorders of bone density and structure, unspecified site: Secondary | ICD-10-CM

## 2021-10-22 ENCOUNTER — Encounter (INDEPENDENT_AMBULATORY_CARE_PROVIDER_SITE_OTHER): Payer: Self-pay | Admitting: Ophthalmology

## 2021-10-22 ENCOUNTER — Other Ambulatory Visit: Payer: Self-pay

## 2021-10-22 ENCOUNTER — Ambulatory Visit (INDEPENDENT_AMBULATORY_CARE_PROVIDER_SITE_OTHER): Payer: Medicare Other | Admitting: Ophthalmology

## 2021-10-22 DIAGNOSIS — H35363 Drusen (degenerative) of macula, bilateral: Secondary | ICD-10-CM | POA: Diagnosis not present

## 2021-10-22 DIAGNOSIS — H26493 Other secondary cataract, bilateral: Secondary | ICD-10-CM

## 2021-10-22 DIAGNOSIS — H353132 Nonexudative age-related macular degeneration, bilateral, intermediate dry stage: Secondary | ICD-10-CM | POA: Diagnosis not present

## 2021-10-22 DIAGNOSIS — H35341 Macular cyst, hole, or pseudohole, right eye: Secondary | ICD-10-CM | POA: Diagnosis not present

## 2021-10-22 DIAGNOSIS — H43392 Other vitreous opacities, left eye: Secondary | ICD-10-CM

## 2021-10-22 NOTE — Assessment & Plan Note (Signed)
Stable OU 

## 2021-10-22 NOTE — Progress Notes (Signed)
10/22/2021     CHIEF COMPLAINT Patient presents for  Chief Complaint  Patient presents with   Retina Follow Up      HISTORY OF PRESENT ILLNESS: Todd Benton is a 88 y.o. male who presents to the clinic today for:   HPI     Retina Follow Up           Laterality: both eyes   Onset: 2 years ago   Severity: mild   Duration: 2 years         Comments   2 yr FU OU OCT.  History of ARMD OU, and history of early macular hole Outer retinal break, status post vitrectomy 2016, with slowly improving acuity OD.  Pt states "things aren't as clear as they used to be, but I notice when looking at the grid lines for my macular degeneration, the lines are straighter than they used to be."      Last edited by Hurman Horn, MD on 10/22/2021  9:17 AM.      Referring physician: Antony Contras, MD [place],  [place] [address]  HISTORICAL INFORMATION:   Selected notes from the [facility]: No current outpatient medications on file. (Ophthalmic Drugs)   No current facility-administered medications for this visit. (Ophthalmic Drugs)   Current Outpatient Medications (Other)  Medication Sig   ENTRESTO 24-26 MG Take 1 tablet by mouth 2 (two) times daily.   lansoprazole (PREVACID) 30 MG capsule TAKE 1 CAPSULE BY MOUTH EVERY DAY   levothyroxine (SYNTHROID, LEVOTHROID) 88 MCG tablet Take 88 mcg by mouth at bedtime.   lovastatin (MEVACOR) 20 MG tablet Take 20 mg by mouth at bedtime.   Magnesium Hydroxide (DULCOLAX PO) Take 1 tablet by mouth at bedtime.   metoprolol succinate (TOPROL-XL) 25 MG 24 hr tablet TAKE 1 TABLET BY MOUTH EVERY DAY   Multiple Vitamins-Minerals (OCUVITE PRESERVISION PO) Take 1 capsule by mouth 2 (two) times daily.   Probiotic Product (PROBIOTIC DAILY PO) Take 1 capsule by mouth daily.   No current facility-administered medications for this visit. (Other)      REVIEW OF SYSTEMS: ROS   Negative  for: Constitutional, Gastrointestinal, Neurological, Skin, Genitourinary, Musculoskeletal, HENT, Endocrine, Cardiovascular, Eyes, Respiratory, Psychiatric, Allergic/Imm, Heme/Lymph Last edited by Hurman Horn, MD on 10/22/2021  9:17 AM.       ALLERGIES Allergies  Allergen Reactions   Penicillins Hives and Swelling    Has patient had a PCN reaction causing immediate rash, facial/tongue/throat swelling, SOB or lightheadedness with hypotension: yes Has patient had a PCN reaction causing severe rash involving mucus membranes or skin necrosis: no Has patient had a PCN reaction that required hospitalization: no Has patient had a PCN reaction occurring within the last 10 years: no -1960 If all of the above answers are "NO", then may proceed with Cephalosporin use.     PAST MEDICAL HISTORY Past Medical History:  Diagnosis Date   Coronary artery disease    nonobstuctive   Diverticulosis    External hemorrhoid    GERD (gastroesophageal reflux disease)    Hiatal hernia    Hx of adenomatous colonic polyps    Hyperlipidemia    Hypertension    Macular degeneration    Peptic stricture of esophagus    Premature ventricular contractions    frequent   Thyroid disease    Ventricular tachycardia    nonsustained   Past Surgical History:  Procedure Laterality Date   AORTIC ARCH ANGIOGRAPHY N/A 12/28/2017   Procedure: AORTIC ARCH ANGIOGRAPHY;  Surgeon: [name], MD;  Location: [facility];  Service: Cardiovascular;  Laterality: N/A;   CARDIAC CATHETERIZATION  12/15/2008   EF50%/nonobstuctive coronary arteries/lt ventricular systolic function is at the lower limits of normal/segmental wall motion abnormalities probably due to catheter-induced ectopy/no severe CAD to explain chest pain   CARDIAC CATHETERIZATION  12/20/1999   EF65/70%/normal coronary arteries with only minor luminal irregularities consistent with someone 88 yrs old/normal lt ventricular systolic function    CATARACT EXTRACTION Bilateral    EYE SURGERY Right    MOHS SURGERY Right 01/07/2019   right ear   RIGHT/LEFT HEART CATH AND CORONARY ANGIOGRAPHY N/A 12/28/2017   Procedure: RIGHT/LEFT HEART CATH AND CORONARY ANGIOGRAPHY;  Surgeon: [name], MD;  Location: [facility];  Service: Cardiovascular;  Laterality: N/A;   TONSILLECTOMY     at age 79    FAMILY HISTORY Family History  Problem Relation Age of Onset   Heart attack Mother        x 2    Stroke Mother    Heart attack Father 8   Lymphoma Son 8   Alzheimer's disease Brother    Colon cancer Neg Hx     SOCIAL HISTORY Social History   Tobacco Use   Smoking status: Never   Smokeless tobacco: Never  Vaping Use   Vaping Use: Never used  Substance Use Topics   Alcohol use: No    Alcohol/week: 0.0 standard drinks   Drug use: No         OPHTHALMIC EXAM:  Base Eye Exam     Visual Acuity (ETDRS)       Right Left   Dist cc 20/25 20/40 -2   Dist ph cc  NI    Correction: Glasses         Tonometry (Tonopen, 9:02 AM)       Right Left   Pressure 18 15         Pupils       Pupils Dark Light Shape React APD   Right PERRL 4 4 Round Minimal None   Left PERRL 3.5 3.5 Round Minimal None         Visual Fields (Counting fingers)       Left Right    Full Full         Extraocular Movement       Right Left    Full Full         Neuro/Psych     Oriented x3: Yes   Mood/Affect: Normal         Dilation     Both eyes: 1.0% Mydriacyl, 2.5% Phenylephrine @ 9:02 AM           Slit Lamp and Fundus Exam     External Exam       Right Left   External Normal Normal         Slit Lamp Exam       Right Left   Lids/Lashes Ptosis, edema Ptosis   Conjunctiva/Sclera White and quiet White and quiet   Cornea Clear Clear   Anterior Chamber Deep and quiet Deep and quiet   Iris Round and reactive Round and reactive   Lens Posterior chamber intraocular lens, with HAZE, 1+ Posterior  capsular opacification Posterior chamber intraocular lens, 1+ Posterior capsular opacification   Anterior Vitreous no vitreous present, vitrectomized Posterior vitreous detachment  Fundus Exam       Right Left   Posterior Vitreous clear, avitric Normal   Disc Normal Normal   C/D Ratio 0.1 0.2   Macula NO SR HEME, NO SRF, NOW WITH MACULAR HOLE closed, Pigment, ATROPHY, STABLE Pigment, NO Subretinal Fluid, NO THICKENING, Pigment, Soft drusen   Vessels Normal Normal   Periphery Normal Normal            IMAGING AND PROCEDURES  Imaging and Procedures for 10/22/21  OCT, Retina - OU - Both Eyes       Right Eye Quality was good. Scan locations included subfoveal. Central Foveal Thickness: 272. Progression has improved. Findings include abnormal foveal contour, retinal drusen , no IRF, no SRF.   Left Eye Quality was good. Scan locations included subfoveal. Central Foveal Thickness: 284. Progression has been stable. Findings include abnormal foveal contour, retinal drusen , no IRF, no SRF.   Notes OS with stable ARMD, no sign of CNVM,  ODWith history of macular hole, with outer retinal break developing, status post vitrectomy 2016 now with restoration of the photoreceptor layer as well as the ELM.             ASSESSMENT/PLAN:  Vitreous floaters, left Minor OS  Bilateral posterior capsular opacification OU currently not in visual axis minor, follow-up with Dr. Clent Jacks as scheduled  Intermediate stage nonexudative age-related macular degeneration of both eyes Stable OU  Macular hole, right eye History of vitrectomy for outer retinal hole loss of photoreceptor layer and ERM, 2016, now with improved acuity from 20/50 baseline to current 20/25  Condition resolved     ICD-10-CM   1. Intermediate stage nonexudative age-related macular degeneration of both eyes  H35.3132 OCT, Retina - OU - Both Eyes    2. Macular hole, right eye  H35.341 OCT, Retina - OU -  Both Eyes    3. Degenerative retinal drusen, both eyes  H35.363     4. Vitreous floaters, left  H43.392     5. Bilateral posterior capsular opacification  H26.493       1.  Continue with AREDS 2 supplementation, 1 tablet twice daily  2.  Follow-up with Dr. Clent Jacks as scheduled  3.  Ophthalmic Meds Ordered this visit:  No orders of the defined types were placed in this encounter.      Return in about 2 years (around 10/23/2023) for DILATE OU, OCT.  There are no Patient Instructions on file for this visit.   Explained the diagnoses, plan, and follow up with the patient and they expressed understanding.  Patient expressed understanding of the importance of proper follow up care.   Clent Demark Mallorie Norrod M.D. Diseases & Surgery of the Retina and Vitreous [facility] [place] 10/22/21     Abbreviations: M myopia (nearsighted); A astigmatism; H hyperopia (farsighted); P presbyopia; Mrx spectacle prescription;  CTL contact lenses; OD right eye; OS left eye; OU both eyes  XT exotropia; ET esotropia; PEK punctate epithelial keratitis; PEE punctate epithelial erosions; DES dry eye syndrome; MGD meibomian gland dysfunction; ATs artificial tears; PFAT's preservative free artificial tears; [place] nuclear sclerotic cataract; PSC posterior subcapsular cataract; ERM epi-retinal membrane; PVD posterior vitreous detachment; RD retinal detachment; DM diabetes mellitus; DR diabetic retinopathy; NPDR non-proliferative diabetic retinopathy; PDR proliferative diabetic retinopathy; CSME clinically significant macular edema; DME diabetic macular edema; dbh dot blot hemorrhages; CWS cotton wool spot; POAG primary open angle glaucoma; C/D cup-to-disc ratio; HVF humphrey visual field; GVF goldmann visual field; OCT  optical coherence tomography; IOP intraocular pressure; BRVO Branch retinal vein occlusion; CRVO central retinal vein occlusion; CRAO central retinal artery occlusion; BRAO branch retinal artery  occlusion; RT retinal tear; SB scleral buckle; PPV pars plana vitrectomy; VH Vitreous hemorrhage; PRP panretinal laser photocoagulation; IVK intravitreal kenalog; VMT vitreomacular traction; MH Macular hole;  NVD neovascularization of the disc; NVE neovascularization elsewhere; AREDS age related eye disease study; ARMD age related macular degeneration; POAG primary open angle glaucoma; EBMD epithelial/anterior basement membrane dystrophy; ACIOL anterior chamber intraocular lens; IOL intraocular lens; PCIOL posterior chamber intraocular lens; Phaco/IOL phacoemulsification with intraocular lens placement; [place] photorefractive keratectomy; LASIK laser assisted in situ keratomileusis; HTN hypertension; DM diabetes mellitus; COPD chronic obstructive pulmonary disease

## 2021-10-22 NOTE — Assessment & Plan Note (Signed)
Minor OS 

## 2021-10-22 NOTE — Assessment & Plan Note (Signed)
History of vitrectomy for outer retinal hole loss of photoreceptor layer and ERM, 2016, now with improved acuity from 20/50 baseline to current 20/25  Condition resolved

## 2021-10-22 NOTE — Assessment & Plan Note (Signed)
OU currently not in visual axis minor, follow-up with Dr. Clent Jacks as scheduled

## 2021-12-02 ENCOUNTER — Other Ambulatory Visit: Payer: Self-pay | Admitting: Cardiology

## 2021-12-02 MED ORDER — ENTRESTO 24-26 MG PO TABS: 1.0000 | ORAL_TABLET | Freq: Two times a day (BID) | ORAL | 3 refills | Status: DC

## 2021-12-06 ENCOUNTER — Telehealth: Payer: Self-pay | Admitting: Cardiology

## 2021-12-06 DIAGNOSIS — I5022 Chronic systolic (congestive) heart failure: Secondary | ICD-10-CM

## 2021-12-06 MED ORDER — LOSARTAN POTASSIUM 25 MG PO TABS: 25.0000 mg | ORAL_TABLET | Freq: Every day | ORAL | 3 refills | Status: DC

## 2021-12-06 NOTE — Telephone Encounter (Signed)
Patient is having cost issues with Entresto.  He is on very low-dose at 24-26 mg twice daily, and has done well without any heart failure decompensation.  He is 89.  I am absolutely okay with switching Entresto to losartan 25 mg daily.  Reviewed recent renal function, GFR 82.  I think he will tolerate losartan just fine.  I will see him in April.  Prescription sent to [facility].   [name], MD Pager: [phone] Office: [phone]

## 2022-02-05 ENCOUNTER — Ambulatory Visit: Payer: Medicare Other | Admitting: Cardiology

## 2022-02-05 ENCOUNTER — Encounter: Payer: Self-pay | Admitting: Cardiology

## 2022-02-05 VITALS — BP 122/71 | HR 70 | Temp 98.0°F | Resp 16 | Ht 73.0 in | Wt 172.0 lb

## 2022-02-05 DIAGNOSIS — I5022 Chronic systolic (congestive) heart failure: Secondary | ICD-10-CM

## 2022-02-05 NOTE — Progress Notes (Signed)
? ? ?Patient is here for follow up visit. ? ?Subjective:  ? ?_0  ID: Todd Benton, male    DOB: Jun 11, 1930, 88 y.o.   MRN: [id number] ? ? ?Chief Complaint  ?Patient presents with  ? Chronic systolic heart failure  ? Follow-up  ?  1 year  ? ? ?HPI  ? ?88 y/o Caucasian male with hypertension, hyperlipidemia, GERD, h/o NSVT, heart failure with reduced ejection fraction. ? ?Patient is doing well, denies chest pain, shortness of breath, palpitations, leg edema, orthopnea, PND, TIA/syncope.  Recently, he had a mechanical fall while getting out of the car that led to minor injuries on his arm and leg which have since resolved.  His Entresto was changed to losartan due to insurance noncoverage.  Overall, has not had any significant change in his symptomatology and denies any overt dyspnea on exertion.  Mild leg edema is stable.   ? ?Current Outpatient Medications:  ?  lansoprazole (PREVACID) 30 MG capsule, TAKE 1 CAPSULE BY MOUTH EVERY DAY, Disp: 90 capsule, Rfl: 3 ?  levothyroxine (SYNTHROID, LEVOTHROID) 88 MCG tablet, Take 88 mcg by mouth at bedtime., Disp: , Rfl:  ?  losartan (COZAAR) 25 MG tablet, Take 1 tablet (25 mg total) by mouth daily., Disp: 90 tablet, Rfl: 3 ?  lovastatin (MEVACOR) 20 MG tablet, Take 20 mg by mouth at bedtime., Disp: , Rfl:  ?  Magnesium Hydroxide (DULCOLAX PO), Take 1 tablet by mouth at bedtime., Disp: , Rfl:  ?  metoprolol succinate (TOPROL-XL) 25 MG 24 hr tablet, TAKE 1 TABLET BY MOUTH EVERY DAY, Disp: 90 tablet, Rfl: 2 ?  Multiple Vitamins-Minerals (OCUVITE PRESERVISION PO), Take 1 capsule by mouth 2 (two) times daily., Disp: , Rfl:  ?  Probiotic Product (PROBIOTIC DAILY PO), Take 1 capsule by mouth daily., Disp: , Rfl:  ? ? ? ?Cardiovascular studies: ? ?EKG 02/05/2022: ?Sinus rhythm 67 bpm ?Poor R wave progression ?Possible old inferior infarct ? ?Echocardiogram 09/22/2018:  ?1. Left ventricle cavity is normal in size. Mild concentric hypertrophy of the left ventricle. Moderate decrease  in global wall motion. Doppler evidence of grade I (impaired) diastolic dysfunction, normal LAP. Calculated EF 38%. ?2. Trace mitral regurgitation. 3. Mild tricuspid regurgitation with peak gradient 19 mm of Hg. Can't calculate PA pressure as IVC is not visualized. 4. No diagnostic change c.f. echo. of 04/14/2018. ? ?ABI 05/19/2018: ?This exam reveals normal perfusion of the right lower extremity (ABI 0.98) and mildly decreased perfusion of the left lower extremity, noted at the dorsalis pedis artery level (ABI 0.83). Normal triphasic waveforms of the right ankle. Moderately abnormal waveforms of the left ankle. ? ?Holter Monitor 48 hours 01/18/2018: ?NSR. Occasional PVCs and rare V-Couplets and triplets. No NSVT. Occasional  PACs. No symmptoms reported. ? ?Coronary angiogram 12/29/2017: ?Nonobstructive coronary artery disease ?LM: Normal ?LAD: Normal. 2nd Diag 50% proximal disease ?LCx: Normal ?RCA: Inadequate visualization. Unusually high takeoff. ? ?Event monitor day 2 alert 12/17/2017: 14 beat nonsustained ventricular tachycardia.  Asymptomatic. ? ? ?Recent labs: ?09/2021: ?Glucose 94, BUN/Cr 15/0.9. EGFR 82. Na/K 145/4.2. Rest of the CMP normal ?H/H 14/42. MCV 94. Platelets 198 ?HbA1C 5.7% ?Chol 155, TG 89, HDL 50, LDL 88 ?TSH 1.6 normal ? ? ?Review of Systems  ?Cardiovascular:  Negative for chest pain, dyspnea on exertion, leg swelling, palpitations and syncope.  ? ?   ?Objective:  ? ? ?Vitals:  ? 02/05/22 0942  ?BP: 122/71  ?Pulse: 70  ?Resp: 16  ?Temp: 98 ?F (36.7 ?C)  ?SpO2:  97%  ? ? ? Physical Exam ?Vitals and nursing note reviewed.  ?Constitutional:   ?   Appearance: He is well-developed.  ?Neck:  ?   Vascular: No JVD.  ?Cardiovascular:  ?   Rate and Rhythm: Normal rate and regular rhythm.  ?   Pulses: Intact distal pulses.  ?   Heart sounds: Normal heart sounds. No murmur heard. ?Pulmonary:  ?   Effort: Pulmonary effort is normal.  ?   Breath sounds: Normal breath sounds. No wheezing or rales.   ?Musculoskeletal:  ?   Right lower leg: Edema (Trace) present.  ?   Left lower leg: No edema.  ? ? ?   ?Assessment & Recommendations:  ? ? ?88 y/o Caucasian male with hypertension, hyperlipidemia, GERD, h/o NSVT, heart failure with reduced ejection fraction. ? ?HFrEF: ?Nonischemic. NYHA class I-II. ?Continue metoprolol succinate 25 mg daily, losartan 25 mg daily.   ?Recommend lasix for as needed use in case of worsening leg swelling ? ?F/u in 1 year ? ?[name], MD ?[facility] ?Pager: [phone] ?Office: [phone] ?If no answer Cell [phone] ?

## 2022-05-08 ENCOUNTER — Other Ambulatory Visit: Payer: Self-pay | Admitting: Cardiology

## 2022-05-08 DIAGNOSIS — I4729 Other ventricular tachycardia: Secondary | ICD-10-CM

## 2022-05-12 ENCOUNTER — Encounter: Payer: Self-pay | Admitting: Internal Medicine

## 2022-07-16 ENCOUNTER — Ambulatory Visit: Payer: Medicare Other | Admitting: Internal Medicine

## 2022-07-16 ENCOUNTER — Encounter: Payer: Self-pay | Admitting: Internal Medicine

## 2022-07-16 VITALS — BP 122/60 | HR 64 | Ht 72.0 in | Wt 181.0 lb

## 2022-07-16 DIAGNOSIS — K219 Gastro-esophageal reflux disease without esophagitis: Secondary | ICD-10-CM | POA: Diagnosis not present

## 2022-07-16 DIAGNOSIS — K5900 Constipation, unspecified: Secondary | ICD-10-CM | POA: Diagnosis not present

## 2022-07-16 DIAGNOSIS — R600 Localized edema: Secondary | ICD-10-CM | POA: Diagnosis not present

## 2022-07-16 MED ORDER — LANSOPRAZOLE 30 MG PO CPDR: DELAYED_RELEASE_CAPSULE | ORAL | 3 refills | Status: DC

## 2022-07-16 NOTE — Patient Instructions (Signed)
_______________________________________________________  If you are age 86 or older, your body mass index should be between 23-30. Your Body mass index is 24.55 kg/m. If this is out of the aforementioned range listed, please consider follow up with your Primary Care Provider.  If you are age 32 or younger, your body mass index should be between 19-25. Your Body mass index is 24.55 kg/m. If this is out of the aformentioned range listed, please consider follow up with your Primary Care Provider.   ________________________________________________________  The [facility] providers would like to encourage you to use [facility] to communicate with providers for non-urgent requests or questions.  Due to long hold times on the telephone, sending your provider a message by [facility] may be a faster and more efficient way to get a response.  Please allow 48 business hours for a response.  Please remember that this is for non-urgent requests.  _______________________________________________________  We have sent the following medications to your pharmacy for you to pick up at your convenience:  Prevacid  Please follow up in one year

## 2022-07-16 NOTE — Progress Notes (Signed)
HISTORY OF PRESENT ILLNESS:  Todd Benton is a 88 y.o. male with a history of GERD complicated by peptic stricture and chronic constipation who presents today for routine GI follow-up.  Last seen May 13, 2021.  See that dictation.  He continues on lansoprazole 30 mg daily.  He takes this in the morning.  This completely controls his reflux symptoms.  No recurrent dysphagia.  No medication side effects are appreciable.  In terms of his constipation, he continues with Duca locks at night.  Generally has daily bowel movements in the morning.  If he misses a day, he will take an additional dose of Dulcolax, which works.  No other complaints.  His last colonoscopy in 2009 showed diverticulosis.  His last upper endoscopy in 2005 was accompanied by peptic stricture dilation.  REVIEW OF SYSTEMS:  All non-GI ROS negative except for lower extremity edema and weakness  Past Medical History:  Diagnosis Date   Coronary artery disease    nonobstuctive   Diverticulosis    External hemorrhoid    GERD (gastroesophageal reflux disease)    Hiatal hernia    Hx of adenomatous colonic polyps    Hyperlipidemia    Hypertension    Macular degeneration    Peptic stricture of esophagus    Premature ventricular contractions    frequent   Thyroid disease    Ventricular tachycardia (HCC)    nonsustained    Past Surgical History:  Procedure Laterality Date   AORTIC ARCH ANGIOGRAPHY N/A 12/28/2017   Procedure: AORTIC ARCH ANGIOGRAPHY;  Surgeon: [name], MD;  Location: [facility];  Service: Cardiovascular;  Laterality: N/A;   CARDIAC CATHETERIZATION  12/15/2008   EF50%/nonobstuctive coronary arteries/lt ventricular systolic function is at the lower limits of normal/segmental wall motion abnormalities probably due to catheter-induced ectopy/no severe CAD to explain chest pain   CARDIAC CATHETERIZATION  12/20/1999   EF65/70%/normal coronary arteries with only minor luminal irregularities  consistent with someone 88 yrs old/normal lt ventricular systolic function   CATARACT EXTRACTION Bilateral    EYE SURGERY Right    MOHS SURGERY Right 01/07/2019   right ear   RIGHT/LEFT HEART CATH AND CORONARY ANGIOGRAPHY N/A 12/28/2017   Procedure: RIGHT/LEFT HEART CATH AND CORONARY ANGIOGRAPHY;  Surgeon: [name], MD;  Location: [facility];  Service: Cardiovascular;  Laterality: N/A;   TONSILLECTOMY     at age 21    [place]  reports that he has never smoked. He has never used smokeless tobacco. He reports that he does not drink alcohol and does not use drugs.  family history includes Alzheimer's disease in his brother; Heart attack in his mother; Heart attack (age of onset: 37) in his father; Lymphoma (age of onset: 78) in his son; Stroke in his mother.  Allergies  Allergen Reactions   Penicillins Hives and Swelling    Has patient had a PCN reaction causing immediate rash, facial/tongue/throat swelling, SOB or lightheadedness with hypotension: yes Has patient had a PCN reaction causing severe rash involving mucus membranes or skin necrosis: no Has patient had a PCN reaction that required hospitalization: no Has patient had a PCN reaction occurring within the last 10 years: no -1960 If all of the above answers are "NO", then may proceed with Cephalosporin use.        PHYSICAL EXAMINATION: Vital signs: BP 122/60   Pulse 64   Ht 6' (1.829 m)   Wt 181 lb (82.1 kg)  SpO2 98%   BMI 24.55 kg/m   Constitutional: generally well-appearing, no acute distress Psychiatric: alert and oriented x3, cooperative Eyes: extraocular movements intact, anicteric, conjunctiva pink Mouth: oral pharynx moist, no lesions Neck: supple no lymphadenopathy Cardiovascular: heart regular rate and rhythm, no murmur Lungs: clear to auscultation bilaterally Abdomen: soft, nontender, nondistended, no obvious ascites, no peritoneal signs, normal bowel sounds, no  organomegaly Rectal: Omitted Extremities: no clubbing or cyanosis.  1-2+ lower extremity edema bilaterally with stasis changes Skin: no lesions on visible extremities Neuro: No focal deficits.  Cranial nerve intact  ASSESSMENT:   1.  GERD complicated by peptic stricture.  Asymptomatic post remote dilation on PPI 2.  Chronic constipation.  Managed with Dulcolax, fiber, and water 3.  Colonoscopy 2009 with diverticulosis 4.  Upper endoscopy 2005 and peptic stricture dilated 5.  Questions regarding drug drug interactions 6.  Lower extremity edema and weakness.  The patient is seeing his PCP next month   PLAN:   1.  Reflux precautions 2.  Continue lansoprazole 30 mg daily.  Refilled today.  Multiple refills.  Medication risks reviewed.  Drug interaction questions addressed. 3.  Continue bowel regimen 4.  Ongoing general medical care with PCP 5.  Routine GI follow-up in 1 year.

## 2022-11-26 ENCOUNTER — Other Ambulatory Visit: Payer: Self-pay | Admitting: Cardiology

## 2022-11-26 DIAGNOSIS — I5022 Chronic systolic (congestive) heart failure: Secondary | ICD-10-CM

## 2023-01-05 ENCOUNTER — Encounter (INDEPENDENT_AMBULATORY_CARE_PROVIDER_SITE_OTHER): Payer: Medicare Other | Admitting: Ophthalmology

## 2023-01-19 ENCOUNTER — Telehealth: Payer: Self-pay | Admitting: Internal Medicine

## 2023-01-19 NOTE — Telephone Encounter (Signed)
Pt states he took 2 dulcolax tabs and nothing happened. Discussed with him that he can take miralax 1-3 doses a day to have BM. Pt verbalized understanding and knows to call if this does not help.

## 2023-01-19 NOTE — Telephone Encounter (Signed)
Inbound call from patient wanted to speak with a nurse , patient stated he is having a constipation for a couple of days now.Please advise

## 2023-01-29 ENCOUNTER — Other Ambulatory Visit: Payer: Self-pay | Admitting: Cardiology

## 2023-01-29 DIAGNOSIS — I4729 Other ventricular tachycardia: Secondary | ICD-10-CM

## 2023-02-06 ENCOUNTER — Ambulatory Visit: Payer: Medicare Other | Admitting: Cardiology

## 2023-02-09 ENCOUNTER — Encounter: Payer: Self-pay | Admitting: Cardiology

## 2023-02-09 ENCOUNTER — Ambulatory Visit: Payer: Medicare Other | Admitting: Cardiology

## 2023-02-09 VITALS — BP 136/66 | HR 66 | Resp 16 | Ht 72.0 in | Wt 176.0 lb

## 2023-02-09 DIAGNOSIS — I5022 Chronic systolic (congestive) heart failure: Secondary | ICD-10-CM

## 2023-02-09 DIAGNOSIS — I4729 Other ventricular tachycardia: Secondary | ICD-10-CM

## 2023-02-09 MED ORDER — METOPROLOL SUCCINATE ER 25 MG PO TB24: 25.0000 mg | ORAL_TABLET | Freq: Every day | ORAL | 3 refills | Status: DC

## 2023-02-09 MED ORDER — FUROSEMIDE 20 MG PO TABS: 20.0000 mg | ORAL_TABLET | Freq: Every day | ORAL | 3 refills | Status: DC

## 2023-02-09 NOTE — Progress Notes (Signed)
Patient is here for follow up visit.  Subjective:    ID: Todd Benton, male    DOB: Sep 17, 1930, 89 y.o.   MRN: [id number]   Chief Complaint  Patient presents with   Congestive Heart Failure   Follow-up    1 year    HPI   89 y/o Caucasian male with hypertension, hyperlipidemia, GERD, h/o NSVT, heart failure with reduced ejection fraction.  Patient has noticed increasing tightness in both legs, and swelling.  He denies any significant exertional dyspnea symptoms.   Current Outpatient Medications:    levothyroxine (SYNTHROID, LEVOTHROID) 88 MCG tablet, Take 88 mcg by mouth at bedtime., Disp: , Rfl:    losartan (COZAAR) 25 MG tablet, TAKE 1 TABLET (25 MG TOTAL) BY MOUTH DAILY., Disp: 90 tablet, Rfl: 0   lovastatin (MEVACOR) 20 MG tablet, Take 20 mg by mouth at bedtime., Disp: , Rfl:    Magnesium Hydroxide (DULCOLAX PO), Take 1 each by mouth at bedtime., Disp: , Rfl:    metoprolol succinate (TOPROL-XL) 25 MG 24 hr tablet, TAKE 1 TABLET BY MOUTH EVERY DAY, Disp: 30 tablet, Rfl: 0   Multiple Vitamins-Minerals (OCUVITE PRESERVISION PO), Take 1 capsule by mouth 2 (two) times daily., Disp: , Rfl:    Probiotic Product (PROBIOTIC DAILY PO), Take 1 capsule by mouth daily., Disp: , Rfl:    lansoprazole (PREVACID) 30 MG capsule, TAKE 1 CAPSULE BY MOUTH EVERY DAY (Patient not taking: Reported on 02/09/2023), Disp: 90 capsule, Rfl: 3    Cardiovascular studies:  EKG 02/09/2023: Sinus rhythm 64 bpm Poor R-wave progression   Echocardiogram 09/22/2018:  1. Left ventricle cavity is normal in size. Mild concentric hypertrophy of the left ventricle. Moderate decrease in global wall motion. Doppler evidence of grade I (impaired) diastolic dysfunction, normal LAP. Calculated EF 38%. 2. Trace mitral regurgitation. 3. Mild tricuspid regurgitation with peak gradient 19 mm of Hg. Can't calculate PA pressure as IVC is not visualized. 4. No diagnostic change c.f. echo. of 04/14/2018.  ABI  05/19/2018: This exam reveals normal perfusion of the right lower extremity (ABI 0.98) and mildly decreased perfusion of the left lower extremity, noted at the dorsalis pedis artery level (ABI 0.83). Normal triphasic waveforms of the right ankle. Moderately abnormal waveforms of the left ankle.  Holter Monitor 48 hours 01/18/2018: NSR. Occasional PVCs and rare V-Couplets and triplets. No NSVT. Occasional  PACs. No symmptoms reported.  Coronary angiogram 12/29/2017: Nonobstructive coronary artery disease LM: Normal LAD: Normal. 2nd Diag 50% proximal disease LCx: Normal RCA: Inadequate visualization. Unusually high takeoff.  Event monitor day 2 alert 12/17/2017: 14 beat nonsustained ventricular tachycardia.  Asymptomatic.   Recent labs: 10/24/2022: Glucose 123, BUN/Cr 19/0.9. EGFR 78. K 4.3. Hb 13.2 HbA1C 5.9% Chol 152, TG 73, HDL 51, LDL 86 TSH 2.5 normal  09/2021: Glucose 94, BUN/Cr 15/0.9. EGFR 82. Na/K 145/4.2. Rest of the CMP normal H/H 14/42. MCV 94. Platelets 198 HbA1C 5.7% Chol 155, TG 89, HDL 50, LDL 88 TSH 1.6 normal   Review of Systems  Cardiovascular:  Positive for leg swelling. Negative for chest pain, dyspnea on exertion, palpitations and syncope.       Objective:    Vitals:   02/09/23 1127  BP: 136/66  Pulse: 66  Resp: 16  SpO2: 93%     Physical Exam Vitals and nursing note reviewed.  Constitutional:      Appearance: He is well-developed.  Neck:     Vascular: No JVD.  Cardiovascular:  Rate and Rhythm: Normal rate and regular rhythm.     Pulses: Intact distal pulses.     Heart sounds: Normal heart sounds. No murmur heard. Pulmonary:     Effort: Pulmonary effort is normal.     Breath sounds: Normal breath sounds. No wheezing or rales.  Musculoskeletal:     Right lower leg: Edema (1+) present.     Left lower leg: Edema (1+) present.        Assessment & Recommendations:    89 y/o Caucasian male with hypertension, hyperlipidemia, GERD,  h/o NSVT, heart failure with reduced ejection fraction.  HFrEF: Nonischemic. NYHA class II. Continue metoprolol succinate 25 mg daily, losartan 25 mg daily.   Recent increase in leg swelling. Started Lasix 20 mg daily.  Check echocardiogram, proBNP.  F/u in 4 to 6 weeks   Todd Negus, MD Pager: [phone] Office: [phone]

## 2023-02-12 LAB — BASIC METABOLIC PANEL
BUN/Creatinine Ratio: 15 (ref 10–24)
BUN: 17 mg/dL (ref 10–36)
CO2: 25 mmol/L (ref 20–29)
Calcium: 8.7 mg/dL (ref 8.6–10.2)
Chloride: 106 mmol/L (ref 96–106)
Creatinine, Ser: 1.13 mg/dL (ref 0.76–1.27)
Glucose: 97 mg/dL (ref 70–99)
Potassium: 4.4 mmol/L (ref 3.5–5.2)
Sodium: 144 mmol/L (ref 134–144)
eGFR: 62 mL/min/{1.73_m2} (ref >59)

## 2023-02-12 LAB — PRO B NATRIURETIC PEPTIDE: NT-Pro BNP: 309 pg/mL (ref 0–486)

## 2023-02-21 ENCOUNTER — Other Ambulatory Visit: Payer: Self-pay | Admitting: Cardiology

## 2023-02-21 DIAGNOSIS — I5022 Chronic systolic (congestive) heart failure: Secondary | ICD-10-CM

## 2023-03-05 ENCOUNTER — Ambulatory Visit: Payer: Medicare Other

## 2023-03-05 DIAGNOSIS — I5022 Chronic systolic (congestive) heart failure: Secondary | ICD-10-CM

## 2023-03-08 NOTE — Progress Notes (Unsigned)
Patient is here for follow up visit.  Subjective:   @Patient  ID: Todd Benton, male    DOB: 03/11/1930, 89 y.o.   MRN: [id number]   No chief complaint on file.   HPI   89 y/o Caucasian male with hypertension, hyperlipidemia, GERD, h/o NSVT, heart failure with reduced ejection fraction.  Patient has noticed increasing tightness in both legs, and swelling.  He denies any significant exertional dyspnea symptoms.   Current Outpatient Medications:    furosemide (LASIX) 20 MG tablet, Take 1 tablet (20 mg total) by mouth daily., Disp: 30 tablet, Rfl: 3   lansoprazole (PREVACID) 30 MG capsule, TAKE 1 CAPSULE BY MOUTH EVERY DAY (Patient not taking: Reported on 02/09/2023), Disp: 90 capsule, Rfl: 3   levothyroxine (SYNTHROID, LEVOTHROID) 88 MCG tablet, Take 88 mcg by mouth at bedtime., Disp: , Rfl:    losartan (COZAAR) 25 MG tablet, TAKE 1 TABLET (25 MG TOTAL) BY MOUTH DAILY., Disp: 90 tablet, Rfl: 0   lovastatin (MEVACOR) 20 MG tablet, Take 20 mg by mouth at bedtime., Disp: , Rfl:    Magnesium Hydroxide (DULCOLAX PO), Take 1 each by mouth at bedtime., Disp: , Rfl:    metoprolol succinate (TOPROL-XL) 25 MG 24 hr tablet, Take 1 tablet (25 mg total) by mouth daily., Disp: 90 tablet, Rfl: 3   Multiple Vitamins-Minerals (OCUVITE PRESERVISION PO), Take 1 capsule by mouth 2 (two) times daily., Disp: , Rfl:    Probiotic Product (PROBIOTIC DAILY PO), Take 1 capsule by mouth daily., Disp: , Rfl:     Cardiovascular studies:  EKG 02/09/2023: Sinus rhythm 64 bpm Poor R-wave progression   Echocardiogram 03/05/2023: Normal LV systolic function with visual EF 60-65%. Left ventricle cavity is normal in size. Normal left ventricular wall thickness. Normal global wall motion. Doppler evidence of grade I (impaired) diastolic dysfunction, normal LAP. Calculated EF 70%. Structurally normal tricuspid valve. Mild to moderate tricuspid regurgitation. No evidence of pulmonary hypertension. Structurally  normal pulmonic valve. Mild pulmonic regurgitation. Compared to 09/2018, TR has slightly progressed.  Echocardiogram 09/22/2018:  1. Left ventricle cavity is normal in size. Mild concentric hypertrophy of the left ventricle. Moderate decrease in global wall motion. Doppler evidence of grade I (impaired) diastolic dysfunction, normal LAP. Calculated EF 38%. 2. Trace mitral regurgitation. 3. Mild tricuspid regurgitation with peak gradient 19 mm of Hg. Can't calculate PA pressure as IVC is not visualized. 4. No diagnostic change c.f. echo. of 04/14/2018.  ABI 05/19/2018: This exam reveals normal perfusion of the right lower extremity (ABI 0.98) and mildly decreased perfusion of the left lower extremity, noted at the dorsalis pedis artery level (ABI 0.83). Normal triphasic waveforms of the right ankle. Moderately abnormal waveforms of the left ankle.  Holter Monitor 48 hours 01/18/2018: NSR. Occasional PVCs and rare V-Couplets and triplets. No NSVT. Occasional  PACs. No symmptoms reported.  Coronary angiogram 12/29/2017: Nonobstructive coronary artery disease LM: Normal LAD: Normal. 2nd Diag 50% proximal disease LCx: Normal RCA: Inadequate visualization. Unusually high takeoff.  Event monitor day 2 alert 12/17/2017: 14 beat nonsustained ventricular tachycardia.  Asymptomatic.   Recent labs: 10/24/2022: Glucose 123, BUN/Cr 19/0.9. EGFR 78. K 4.3. Hb 13.2 HbA1C 5.9% Chol 152, TG 73, HDL 51, LDL 86 TSH 2.5 normal  09/2021: Glucose 94, BUN/Cr 15/0.9. EGFR 82. Na/K 145/4.2. Rest of the CMP normal H/H 14/42. MCV 94. Platelets 198 HbA1C 5.7% Chol 155, TG 89, HDL 50, LDL 88 TSH 1.6 normal   Review of Systems  Cardiovascular:  Positive for  leg swelling. Negative for chest pain, dyspnea on exertion, palpitations and syncope.       Objective:    There were no vitals filed for this visit.    Physical Exam Vitals and nursing note reviewed.  Constitutional:      Appearance: He is  well-developed.  Neck:     Vascular: No JVD.  Cardiovascular:     Rate and Rhythm: Normal rate and regular rhythm.     Pulses: Intact distal pulses.     Heart sounds: Normal heart sounds. No murmur heard. Pulmonary:     Effort: Pulmonary effort is normal.     Breath sounds: Normal breath sounds. No wheezing or rales.  Musculoskeletal:     Right lower leg: Edema (1+) present.     Left lower leg: Edema (1+) present.        Assessment & Recommendations:    89 y/o Caucasian male with hypertension, hyperlipidemia, GERD, h/o NSVT, heart failure with reduced ejection fraction.  HFrEF: Nonischemic. NYHA class II. Continue metoprolol succinate 25 mg daily, losartan 25 mg daily.   Recent increase in leg swelling. Started Lasix 20 mg daily.  Check echocardiogram, proBNP.  F/u in 4 to 6 weeks   [name], MD Pager: [phone] Office: [phone]

## 2023-03-12 ENCOUNTER — Encounter: Payer: Self-pay | Admitting: Cardiology

## 2023-03-12 ENCOUNTER — Ambulatory Visit: Payer: Medicare Other | Admitting: Cardiology

## 2023-03-12 VITALS — BP 138/75 | HR 74 | Ht 72.0 in | Wt 174.0 lb

## 2023-03-12 DIAGNOSIS — R600 Localized edema: Secondary | ICD-10-CM | POA: Insufficient documentation

## 2023-04-13 ENCOUNTER — Other Ambulatory Visit: Payer: Self-pay | Admitting: *Deleted

## 2023-04-13 DIAGNOSIS — R600 Localized edema: Secondary | ICD-10-CM

## 2023-04-28 ENCOUNTER — Encounter: Payer: Self-pay | Admitting: Vascular Surgery

## 2023-04-28 ENCOUNTER — Ambulatory Visit (HOSPITAL_COMMUNITY)
Admission: RE | Admit: 2023-04-28 | Discharge: 2023-04-28 | Disposition: A | Discharge status: Home / Self Care | Payer: Medicare Other | Source: Ambulatory Visit | Attending: Vascular Surgery | Admitting: Vascular Surgery

## 2023-04-28 ENCOUNTER — Ambulatory Visit: Payer: Medicare Other | Admitting: Vascular Surgery

## 2023-04-28 VITALS — BP 127/74 | HR 57 | Temp 97.8°F | Resp 16 | Ht 72.0 in | Wt 169.0 lb

## 2023-04-28 DIAGNOSIS — R600 Localized edema: Secondary | ICD-10-CM

## 2023-04-28 DIAGNOSIS — I872 Venous insufficiency (chronic) (peripheral): Secondary | ICD-10-CM | POA: Diagnosis not present

## 2023-04-28 NOTE — Progress Notes (Signed)
Patient name: Todd Benton MRN: [id number] DOB: 08-Aug-1930 Sex: male  REASON FOR CONSULT: Leg edema  HPI: Todd Benton is a 89 y.o. male, with history of hypertension, hyperlipidemia, coronary artery disease that presents for evaluation of leg edema from cardiology that is not suspected to be related to heart failure.  States has had lower extremity swelling for years in the right leg.  Recently had swelling in the left leg.  He has known lower extremity discoloration as well.  Does not wear compression stockings.  Does not elevate his legs.  States he sits at a computer a lot during the day and is not very active.  Past Medical History:  Diagnosis Date   Coronary artery disease    nonobstuctive   Diverticulosis    External hemorrhoid    GERD (gastroesophageal reflux disease)    Hiatal hernia    Hx of adenomatous colonic polyps    Hyperlipidemia    Hypertension    Macular degeneration    Peptic stricture of esophagus    Premature ventricular contractions    frequent   Thyroid disease    Ventricular tachycardia (HCC)    nonsustained    Past Surgical History:  Procedure Laterality Date   AORTIC ARCH ANGIOGRAPHY N/A 12/28/2017   Procedure: AORTIC ARCH ANGIOGRAPHY;  Surgeon: [name], MD;  Location: [facility];  Service: Cardiovascular;  Laterality: N/A;   CARDIAC CATHETERIZATION  12/15/2008   EF50%/nonobstuctive coronary arteries/lt ventricular systolic function is at the lower limits of normal/segmental wall motion abnormalities probably due to catheter-induced ectopy/no severe CAD to explain chest pain   CARDIAC CATHETERIZATION  12/20/1999   EF65/70%/normal coronary arteries with only minor luminal irregularities consistent with someone 89 yrs old/normal lt ventricular systolic function   CATARACT EXTRACTION Bilateral    EYE SURGERY Right    MOHS SURGERY Right 01/07/2019   right ear   RIGHT/LEFT HEART CATH AND CORONARY ANGIOGRAPHY N/A 12/28/2017    Procedure: RIGHT/LEFT HEART CATH AND CORONARY ANGIOGRAPHY;  Surgeon: [name], MD;  Location: [facility];  Service: Cardiovascular;  Laterality: N/A;   TONSILLECTOMY     at age 89    Family History  Problem Relation Age of Onset   Heart attack Mother        x 2    Stroke Mother    Heart attack Father 2   Lymphoma Son 8   Alzheimer's disease Brother    Colon cancer Neg Hx     SOCIAL HISTORY: Social History   Socioeconomic History   Marital status: Widowed    Spouse name: Claris Che   Number of children: 2   Years of education: Not on file   Highest education level: Not on file  Occupational History   Occupation: Retired    Associate Professor: RETIRED    Comment: Worked in Event organiser at [employer]  Tobacco Use   Smoking status: Never   Smokeless tobacco: Never  Vaping Use   Vaping Use: Never used  Substance and Sexual Activity   Alcohol use: No    Alcohol/week: 0.0 standard drinks of alcohol   Drug use: No   Sexual activity: Not on file  Other Topics Concern   Not on file  Social History Narrative   Not on file   Social Determinants of Health   Financial Resource Strain: Not on file  Food Insecurity: Not on file  Transportation Needs: Not on file  Physical Activity: Not on file  Stress: Not on file  Social Connections: Not on file  Intimate Partner Violence: Not on file    Allergies  Allergen Reactions   Clindamycin Other (See Comments)   Clindamycin Hcl Other (See Comments)   Hydrochlorothiazide     Other Reaction(s): 25 MG intolerant   Other     Other Reaction(s): Not available   Penicillin G Benzathine     Other Reaction(s): hives   Penicillins Hives and Swelling    Has patient had a PCN reaction causing immediate rash, facial/tongue/throat swelling, SOB or lightheadedness with hypotension: yes Has patient had a PCN reaction causing severe rash involving mucus membranes or skin necrosis: no Has patient had a PCN reaction that required  hospitalization: no Has patient had a PCN reaction occurring within the last 10 years: no -1960 If all of the above answers are "NO", then may proceed with Cephalosporin use.     Current Outpatient Medications  Medication Sig Dispense Refill   furosemide (LASIX) 20 MG tablet Take 1 tablet (20 mg total) by mouth daily. 30 tablet 3   lansoprazole (PREVACID) 30 MG capsule Take 30 mg by mouth daily at 12 noon.     levothyroxine (SYNTHROID, LEVOTHROID) 88 MCG tablet Take 88 mcg by mouth at bedtime.     losartan (COZAAR) 25 MG tablet TAKE 1 TABLET (25 MG TOTAL) BY MOUTH DAILY. 90 tablet 0   lovastatin (MEVACOR) 20 MG tablet Take 20 mg by mouth at bedtime.     Magnesium Hydroxide (DULCOLAX PO) Take 1 each by mouth at bedtime.     metoprolol succinate (TOPROL-XL) 25 MG 24 hr tablet Take 1 tablet (25 mg total) by mouth daily. 90 tablet 3   Multiple Vitamins-Minerals (OCUVITE PRESERVISION PO) Take 1 capsule by mouth 2 (two) times daily.     Probiotic Product (PROBIOTIC DAILY PO) Take 1 capsule by mouth daily.     No current facility-administered medications for this visit.    REVIEW OF SYSTEMS:  [X]  denotes positive finding, [ ]  denotes negative finding Cardiac  Comments:  Chest pain or chest pressure:    Shortness of breath upon exertion:    Short of breath when lying flat:    Irregular heart rhythm:        Vascular    Pain in calf, thigh, or hip brought on by ambulation:    Pain in feet at night that wakes you up from your sleep:     Blood clot in your veins:    Leg swelling:  x       Pulmonary    Oxygen at home:    Productive cough:     Wheezing:         Neurologic    Sudden weakness in arms or legs:     Sudden numbness in arms or legs:     Sudden onset of difficulty speaking or slurred speech:    Temporary loss of vision in one eye:     Problems with dizziness:         Gastrointestinal    Blood in stool:     Vomited blood:         Genitourinary    Burning when urinating:      Blood in urine:        Psychiatric    Major depression:         Hematologic    Bleeding problems:    Problems with blood clotting too easily:  Skin    Rashes or ulcers:        Constitutional    Fever or chills:      PHYSICAL EXAM: There were no vitals filed for this visit.  GENERAL: The patient is a well-nourished male, in no acute distress. The vital signs are documented above. CARDIAC: There is a regular rate and rhythm.  VASCULAR:  Bilateral femoral pulses palpable Bilateral AT pulses palpable Notable lipodermatosclerosis to bilateral lower extremities with skin thickening PULMONARY: No respiratory distress. ABDOMEN: Soft and non-tender.  MUSCULOSKELETAL: There are no major deformities or cyanosis. NEUROLOGIC: No focal weakness or paresthesias are detected. SKIN: There are no ulcers or rashes noted. PSYCHIATRIC: The patient has a normal affect.  DATA:    Lower Venous Reflux Study   Patient Name:  Todd Benton  Date of Exam:   04/28/2023  Medical Rec #: [id number]       Accession #:    [id number]  Date of Birth: 1930-07-05      Patient Gender: M  Patient Age:   15 years  Exam Location:  [facility]  Procedure:      VAS [place] LOWER EXTREMITY VENOUS REFLUX  Referring Phys: Sherald Hess    ---------------------------------------------------------------------------  -----    Indications: Edema, redness and tightness of the left lower leg.    Performing Technologist: Dorthula Matas RVS, RCS     Examination Guidelines: A complete evaluation includes B-mode imaging,  spectral  Doppler, color Doppler, and power Doppler as needed of all accessible  portions  of each vessel. Bilateral testing is considered an integral part of a  complete  examination. Limited examinations for reoccurring indications may be  performed  as noted. The reflux portion of the exam is performed with the patient in  reverse Trendelenburg.  Significant  venous reflux is defined as >500 ms in the superficial venous  system, and >1 second in the deep venous system.     +--------------+---------+------+-----------+------------+--------+  LEFT         Reflux NoRefluxReflux TimeDiameter cmsComments                          Yes                                   +--------------+---------+------+-----------+------------+--------+  CFV                    yes   >1 second                       +--------------+---------+------+-----------+------------+--------+  FV mid        no                                              +--------------+---------+------+-----------+------------+--------+  Popliteal    no                                              +--------------+---------+------+-----------+------------+--------+  GSV at [facility]    no  0.38              +--------------+---------+------+-----------+------------+--------+  GSV prox thighno                            0.21              +--------------+---------+------+-----------+------------+--------+  GSV mid thigh no                            0.28              +--------------+---------+------+-----------+------------+--------+  GSV dist thighno                            0.24              +--------------+---------+------+-----------+------------+--------+  GSV at knee   no                            0.26              +--------------+---------+------+-----------+------------+--------+  GSV prox calf no                            0.26              +--------------+---------+------+-----------+------------+--------+  SSV Pop Fossa no                            0.17              +--------------+---------+------+-----------+------------+--------+         Summary:  Left:  - No evidence of deep vein thrombosis from the common femoral through the  popliteal veins.  - No evidence of  superficial venous thrombosis.  - The common femoral vein is not competent.  - The great and small saphenous veins are competent.    *See table(s) above for measurements and observations.   Electronically signed by Sherald Hess MD on 04/28/2023 at 12:15:25 PM.        Final     Assessment/Plan:  89 y.o. male, with history of hypertension, hyperlipidemia, coronary artery disease that presents for evaluation of leg edema from cardiology that is not suspected to be related to heart failure.  His exam is consistent with chronic venous insufficiency with CEAP classification C4 given lipodermatosclerosis.  All of his venous reflux is in the deep venous system in the left leg.  I discussed the etiology of venous reflux.  I discussed with deep venous reflux this is not amendable to surgical intervention.  I have recommended leg elevation, exercise and compression stockings.  We did get him sized for knee-high medical grade stockings today as he does not feel he can wear thigh-high stocking.  I discussed him wearing these on a daily basis.  All of his superficial veins are competent and thus not a candidate for laser etc.  He can follow-up PRN.   Cephus Shelling, MD [facility] Office: [phone]

## 2023-05-08 ENCOUNTER — Other Ambulatory Visit: Payer: Self-pay | Admitting: Cardiology

## 2023-05-20 ENCOUNTER — Other Ambulatory Visit: Payer: Self-pay | Admitting: Cardiology

## 2023-05-20 DIAGNOSIS — I5022 Chronic systolic (congestive) heart failure: Secondary | ICD-10-CM

## 2023-08-07 ENCOUNTER — Other Ambulatory Visit: Payer: Self-pay | Admitting: Internal Medicine

## 2023-08-15 ENCOUNTER — Other Ambulatory Visit: Payer: Self-pay | Admitting: Cardiology

## 2023-08-15 DIAGNOSIS — I5022 Chronic systolic (congestive) heart failure: Secondary | ICD-10-CM

## 2023-09-10 ENCOUNTER — Encounter: Payer: Self-pay | Admitting: Cardiology

## 2023-09-10 ENCOUNTER — Ambulatory Visit: Payer: Medicare Other | Attending: Cardiology | Admitting: Cardiology

## 2023-09-10 ENCOUNTER — Ambulatory Visit: Payer: Medicare Other | Admitting: Cardiology

## 2023-09-10 VITALS — BP 116/70 | HR 73 | Resp 16 | Ht 72.0 in | Wt 162.6 lb

## 2023-09-10 DIAGNOSIS — I5020 Unspecified systolic (congestive) heart failure: Secondary | ICD-10-CM

## 2023-09-10 NOTE — Progress Notes (Signed)
  Cardiology Office Note:  .   Date:  09/10/2023  ID:  Todd Benton, DOB Feb 01, 1930, MRN [id number] PCP: Tally Joe, MD  [facility] Providers Cardiologist:  Truett Mainland, MD PCP: Tally Joe, MD  Chief Complaint  Patient presents with   Bilateral leg edema   Chronic systolic heart failure    Follow-up    6 months      History of Present Illness: .    Todd Benton is a 89 y.o. male with hypertension, hyperlipidemia, GERD, h/o NSVT, HFrEF, leg edema  Patient was evaluated by vascular surgeon Dr. Sherald Hess.  He was found to have deep venous insufficiency, which is unfortunately not amenable to any intervention correction.  He was recommended leg elevation, exercise, wearing compression stockings.Leg swelling has somewhat improved.  He has no other cardiac symptoms.  He recently underwent surgery on both his eyelids, and is still recovering from the same.  Reviewed recent epic panel results with the patient, details below.  Vitals:   09/10/23 1601  BP: 116/70  Resp: 16     ROS:  Review of Systems  Cardiovascular:  Positive for leg swelling (Improved).     Studies Reviewed: Marland Kitchen        Independently interpreted Labs 05/05/2023: Chol 148, TG 82, HDL 56, LDL 76   Physical Exam:   Physical Exam Vitals and nursing note reviewed.  Constitutional:      General: He is not in acute distress. Neck:     Vascular: No JVD.  Cardiovascular:     Rate and Rhythm: Normal rate and regular rhythm.     Heart sounds: Normal heart sounds. No murmur heard. Pulmonary:     Effort: Pulmonary effort is normal.     Breath sounds: Normal breath sounds. No wheezing or rales.  Musculoskeletal:     Right lower leg: Edema (Trace) present.     Left lower leg: Edema (Trace) present.      VISIT DIAGNOSES:   ICD-10-CM   1. HFrEF (heart failure with reduced ejection fraction) (HCC)  I50.20        ASSESSMENT AND PLAN: .    Todd Benton is a 89 y.o. male  with hypertension, hyperlipidemia, GERD, h/o NSVT, HFrEF, leg edema   Leg edema: Secondary to deep venous insufficiency. Continue conservative management as per vascular surgery recommendations, including leg elevation when possible, compression stockings when possible.   HFrEF: Prior history with recovered and persistently normal EF now (echocardiogram 02/2023). Continue metoprolol succinate 25 mg daily, losartan 25 mg daily   F/u in 1 year  Signed, [name], MD

## 2023-09-10 NOTE — Patient Instructions (Signed)
 Medication Instructions:   Your physician recommends that you continue on your current medications as directed. Please refer to the Current Medication list given to you today.  *If you need a refill on your cardiac medications before your next appointment, please call your pharmacy*    Follow-Up: At [facility], you and your health needs are our priority.  As part of our continuing mission to provide you with exceptional heart care, we have created designated Provider Care Teams.  These Care Teams include your primary Cardiologist (physician) and Advanced Practice Providers (APPs -  Physician Assistants and Nurse Practitioners) who all work together to provide you with the care you need, when you need it.  We recommend signing up for the patient portal called "MyChart".  Sign up information is provided on this After Visit Summary.  MyChart is used to connect with patients for Virtual Visits (Telemedicine).  Patients are able to view lab/test results, encounter notes, upcoming appointments, etc.  Non-urgent messages can be sent to your provider as well.   To learn more about what you can do with MyChart, go to [email or web address].    Your next appointment:   1 year(s)  Provider:   DR. Rosemary Holms

## 2023-11-05 ENCOUNTER — Other Ambulatory Visit: Payer: Self-pay | Admitting: Internal Medicine

## 2023-11-21 ENCOUNTER — Other Ambulatory Visit: Payer: Self-pay | Admitting: Cardiology

## 2023-11-21 DIAGNOSIS — I4729 Other ventricular tachycardia: Secondary | ICD-10-CM

## 2024-01-31 ENCOUNTER — Other Ambulatory Visit: Payer: Self-pay | Admitting: Internal Medicine

## 2024-03-13 ENCOUNTER — Telehealth: Payer: Self-pay | Admitting: Physician Assistant

## 2024-03-13 ENCOUNTER — Other Ambulatory Visit: Payer: Self-pay | Admitting: Internal Medicine

## 2024-03-13 NOTE — Telephone Encounter (Signed)
  Patient's pharmacy called this afternoon, he is out of Prevacid  and was trying to get a refill through the office.  I have refilled Prevacid  30 mg 1 p.o. every morning AC #90 and 2 refills, then will need office visit.

## 2024-04-12 ENCOUNTER — Ambulatory Visit: Admitting: Internal Medicine

## 2024-04-12 ENCOUNTER — Encounter: Payer: Self-pay | Admitting: Internal Medicine

## 2024-04-12 VITALS — BP 120/60 | HR 66 | Ht 71.0 in | Wt 160.0 lb

## 2024-04-12 DIAGNOSIS — R1310 Dysphagia, unspecified: Secondary | ICD-10-CM | POA: Diagnosis not present

## 2024-04-12 DIAGNOSIS — K219 Gastro-esophageal reflux disease without esophagitis: Secondary | ICD-10-CM | POA: Diagnosis not present

## 2024-04-12 DIAGNOSIS — K5909 Other constipation: Secondary | ICD-10-CM

## 2024-04-12 DIAGNOSIS — K5900 Constipation, unspecified: Secondary | ICD-10-CM

## 2024-04-12 DIAGNOSIS — J392 Other diseases of pharynx: Secondary | ICD-10-CM
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
## 2024-04-12 NOTE — Patient Instructions (Signed)
 _______________________________________________________  If your blood pressure at your visit was 140/90 or greater, please contact your primary care physician to follow up on this. Please follow up in one year. _______________________________________________________  If you are age 88 or older, your body mass index should be between 23-30. Your Body mass index is 22.32 kg/m. If this is out of the aforementioned range listed, please consider follow up with your Primary Care Provider.  If you are age 54 or younger, your body mass index should be between 19-25. Your Body mass index is 22.32 kg/m. If this is out of the aformentioned range listed, please consider follow up with your Primary Care Provider.   ________________________________________________________  The [facility] providers would like to encourage you to use MYCHART to communicate with providers for non-urgent requests or questions.  Due to long hold times on the telephone, sending your provider a message by [facility] may be a faster and more efficient way to get a response.  Please allow 48 business hours for a response.  Please remember that this is for non-urgent requests.  _______________________________________________________

## 2024-05-04 ENCOUNTER — Other Ambulatory Visit: Payer: Self-pay | Admitting: Cardiology

## 2024-05-04 DIAGNOSIS — I5022 Chronic systolic (congestive) heart failure: Secondary | ICD-10-CM

## 2024-05-06 ENCOUNTER — Encounter: Payer: Self-pay | Admitting: Advanced Practice Midwife

## 2024-06-07 ENCOUNTER — Ambulatory Visit: Attending: Cardiology | Admitting: Cardiology

## 2024-06-07 ENCOUNTER — Encounter: Payer: Self-pay | Admitting: Cardiology

## 2024-06-07 VITALS — BP 108/60 | HR 76 | Ht 71.0 in | Wt 160.0 lb

## 2024-06-07 DIAGNOSIS — I5032 Chronic diastolic (congestive) heart failure: Secondary | ICD-10-CM

## 2024-06-07 MED ORDER — LOSARTAN POTASSIUM 25 MG PO TABS: 25.0000 mg | ORAL_TABLET | Freq: Every day | ORAL | 3 refills | Status: AC

## 2024-06-07 NOTE — Progress Notes (Addendum)
  Cardiology Office Note:  .   Date:  06/07/2024  ID:  Todd Benton, DOB December 21, 1929, MRN [id number] PCP: Seabron Lenis, MD  [facility] Providers Cardiologist:  Newman Lawrence, MD PCP: Seabron Lenis, MD  Chief Complaint  Patient presents with   Hypertension   HFrEF      History of Present Illness: .    Todd Benton is a 89 y.o. male with hypertension, hyperlipidemia, GERD, h/o NSVT, HFrEF, leg edema  Patient has not moved into a friend's home facility.  She is doing well without any chest pain or shortness of breath symptoms.  Leg edema is stable.  Blood pressure has been lower, as low as 90/50 mmHg at times.  Vitals:   06/07/24 1003  BP: 108/60  Pulse: 76  SpO2: 97%      ROS:  Review of Systems  Cardiovascular:  Positive for leg swelling (Improved).     Studies Reviewed: SABRA        EKG 06/07/2024: Sinus rhythm with occasional Premature ventricular complexes  Labs 11/2023: Chol 150, TG 66, HDL 62, LDL 75 HbA1C 5.9% Hb 13.5 Cr 1.1 TSH 1.9  When compared with ECG of 16-Dec-2008 04:25, Premature ventricular complexes are now Present    Labs 05/05/2023: Chol 148, TG 82, HDL 56, LDL 76  Echocardiogram 02/2023:  Normal LV systolic function with visual EF 60-65%. Left ventricle cavity  is normal in size. Normal left ventricular wall thickness. Normal global  wall motion. Doppler evidence of grade I (impaired) diastolic dysfunction,  normal LAP. Calculated EF 70%.  Structurally normal tricuspid valve. Mild to moderate tricuspid  regurgitation. No evidence of pulmonary hypertension.  Structurally normal pulmonic valve. Mild pulmonic regurgitation.  Compared to 09/2018, TR has slightly progressed.    Physical Exam:   Physical Exam Vitals and nursing note reviewed.  Constitutional:      General: He is not in acute distress. Neck:     Vascular: No JVD.  Cardiovascular:     Rate and Rhythm: Normal rate and regular rhythm.     Heart sounds: Normal  heart sounds. No murmur heard. Pulmonary:     Effort: Pulmonary effort is normal.     Breath sounds: Normal breath sounds. No wheezing or rales.  Musculoskeletal:     Right lower leg: Edema (Trace) present.     Left lower leg: Edema (Trace) present.      VISIT DIAGNOSES:   ICD-10-CM   1. Heart failure with recovered ejection fraction (HFrecEF) (HCC)  I50.32 EKG 12-Lead    losartan  (COZAAR ) 25 MG tablet        ASSESSMENT AND PLAN: .    Todd Benton is a 89 y.o. male with hypertension, hyperlipidemia, GERD, h/o NSVT, HFrEF, leg edema   Leg edema: Secondary to deep venous insufficiency. Continue conservative management as per vascular surgery recommendations, including leg elevation when possible, compression stockings when possible.   HFrEF: Prior history with recovered and persistently normal EF now (echocardiogram 02/2023). Given low normal blood pressures, I will wean him off metoprolol  succinate by taking 1 pill every other day for next 2 days, and then stop.  Continue losartan  25 mg daily.  In future, if blood pressure remains low, can reduce losartan  to 12.5 mg daily, or 25 mg every other day.  F/u in 1 year  Signed, Newman JINNY Lawrence, MD

## 2024-06-07 NOTE — Patient Instructions (Signed)
 Medication Instructions:  TITRATE Metoprolol  as directed by Dr. Elmira   *If you need a refill on your cardiac medications before your next appointment, please call your pharmacy*  Follow-Up: At [facility], you and your health needs are our priority.  As part of our continuing mission to provide you with exceptional heart care, our providers are all part of one team.  This team includes your primary Cardiologist (physician) and Advanced Practice Providers or APPs (Physician Assistants and Nurse Practitioners) who all work together to provide you with the care you need, when you need it.  Your next appointment:   1 year(s)  Provider:   Newman JINNY Elmira, MD

## 2024-07-20 ENCOUNTER — Other Ambulatory Visit: Payer: Self-pay | Admitting: Internal Medicine
